# Patient Record
Sex: Male | Born: 1975
Health system: Southern US, Community
[De-identification: ages and names within clinical notes are randomized; demographics above are authoritative.]

## PROBLEM LIST (undated history)

## (undated) DIAGNOSIS — I10 Essential (primary) hypertension: Secondary | ICD-10-CM

## (undated) DIAGNOSIS — K219 Gastro-esophageal reflux disease without esophagitis: Secondary | ICD-10-CM

---

## 2000-09-06 ENCOUNTER — Emergency Department (HOSPITAL_COMMUNITY): Admission: EM | Admit: 2000-09-06 | Discharge: 2000-09-06 | Payer: Self-pay

## 2001-10-13 ENCOUNTER — Other Ambulatory Visit: Admission: RE | Admit: 2001-10-13 | Discharge: 2001-10-13 | Payer: Self-pay | Admitting: Dermatology

## 2002-01-14 ENCOUNTER — Encounter: Payer: Self-pay | Admitting: Emergency Medicine

## 2002-01-14 ENCOUNTER — Emergency Department (HOSPITAL_COMMUNITY): Admission: EM | Admit: 2002-01-14 | Discharge: 2002-01-14 | Payer: Self-pay | Admitting: Emergency Medicine

## 2002-08-11 ENCOUNTER — Emergency Department (HOSPITAL_COMMUNITY): Admission: EM | Admit: 2002-08-11 | Discharge: 2002-08-11 | Payer: Self-pay | Admitting: Emergency Medicine

## 2002-08-11 ENCOUNTER — Encounter: Payer: Self-pay | Admitting: Emergency Medicine

## 2004-11-27 ENCOUNTER — Emergency Department (HOSPITAL_COMMUNITY): Admission: EM | Admit: 2004-11-27 | Discharge: 2004-11-27 | Payer: Self-pay | Admitting: Family Medicine

## 2012-09-08 ENCOUNTER — Other Ambulatory Visit: Payer: Self-pay | Admitting: Occupational Medicine

## 2012-09-08 ENCOUNTER — Ambulatory Visit: Payer: Self-pay

## 2012-09-08 DIAGNOSIS — R52 Pain, unspecified: Secondary | ICD-10-CM

## 2013-06-16 ENCOUNTER — Encounter (HOSPITAL_COMMUNITY): Payer: Self-pay | Admitting: Emergency Medicine

## 2013-06-16 ENCOUNTER — Emergency Department (HOSPITAL_COMMUNITY)
Admission: EM | Admit: 2013-06-16 | Discharge: 2013-06-16 | Disposition: A | Discharge status: Home / Self Care | Payer: 59 | Attending: Emergency Medicine | Admitting: Emergency Medicine

## 2013-06-16 DIAGNOSIS — S0591XA Unspecified injury of right eye and orbit, initial encounter: Secondary | ICD-10-CM

## 2013-06-16 DIAGNOSIS — IMO0002 Reserved for concepts with insufficient information to code with codable children: Secondary | ICD-10-CM | POA: Insufficient documentation

## 2013-06-16 DIAGNOSIS — S0081XA Abrasion of other part of head, initial encounter: Secondary | ICD-10-CM

## 2013-06-16 DIAGNOSIS — Y9339 Activity, other involving climbing, rappelling and jumping off: Secondary | ICD-10-CM | POA: Insufficient documentation

## 2013-06-16 DIAGNOSIS — W2209XA Striking against other stationary object, initial encounter: Secondary | ICD-10-CM | POA: Insufficient documentation

## 2013-06-16 DIAGNOSIS — Y9229 Other specified public building as the place of occurrence of the external cause: Secondary | ICD-10-CM | POA: Insufficient documentation

## 2013-06-16 DIAGNOSIS — H538 Other visual disturbances: Secondary | ICD-10-CM | POA: Insufficient documentation

## 2013-06-16 DIAGNOSIS — S0590XA Unspecified injury of unspecified eye and orbit, initial encounter: Secondary | ICD-10-CM | POA: Insufficient documentation

## 2013-06-16 HISTORY — DX: Gastro-esophageal reflux disease without esophagitis: K21.9

## 2013-06-16 MED ORDER — TETRACAINE HCL 0.5 % OP SOLN
1.0000 [drp] | Freq: Once | OPHTHALMIC | Status: DC
  Filled 2013-06-16: qty 2

## 2013-06-16 MED ORDER — KETOROLAC TROMETHAMINE 0.5 % OP SOLN: 1.0000 [drp] | Freq: Four times a day (QID) | OPHTHALMIC | Status: DC

## 2013-06-16 MED ORDER — FLUORESCEIN SODIUM 1 MG OP STRP
ORAL_STRIP | OPHTHALMIC | Status: AC
  Filled 2013-06-16: qty 1

## 2013-06-16 MED ORDER — FLUORESCEIN SODIUM 1 MG OP STRP
1.0000 | ORAL_STRIP | Freq: Once | OPHTHALMIC | Status: DC
  Filled 2013-06-16: qty 1

## 2013-06-16 NOTE — ED Provider Notes (Signed)
CSN: [id number]     Arrival date & time 06/16/13  1533 History   First MD Initiated Contact with Patient 06/16/13 1535     Chief Complaint  Patient presents with  . Eye Injury   (Consider location/radiation/quality/duration/timing/severity/associated sxs/prior Treatment) Patient is a 37 y.o. male presenting with eye injury. The history is provided by the patient and medical records.  Eye Injury  This is a 37 year old male with no significant past medical history presenting to the ED for right eye injury. Patient stated he was at the park playing with his dog, attempted to jump over a rock, and  hit in his right eye on a cable wire.  States initially after injury he could not see out of the right eye but now vision has improved and is only mildly blurry.  Denies left eye involvement. Wife notes that he appeared to lose consciousness for a few seconds but was never disoriented.  Pt states there is somewhat of a foreign body sensation along his right lateral eye.  Patient does not wear glasses or contact lenses, but has had prior LASIK eye surgery on both eyes. Patient's ophthalmologist is Dr. Delaney Meigs.  Ice applied by EMS with good improvement of pain.  Denies any dizziness, confusion, tinnitus, changes in speech, or unsteady gait.  Pt AAOx3 on arrival, VS stable.  No past medical history on file. No past surgical history on file. No family history on file. History  Substance Use Topics  . Smoking status: Not on file  . Smokeless tobacco: Not on file  . Alcohol Use: Not on file    Review of Systems  Eyes: Positive for pain and visual disturbance.  All other systems reviewed and are negative.    Allergies  Review of patient's allergies indicates not on file.  Home Medications  No current outpatient prescriptions on file. BP 137/82  Pulse 59  Temp(Src) 98 F (36.7 C) (Oral)  Resp 18  SpO2 97%  Physical Exam  Nursing note and vitals reviewed. Constitutional: He is oriented to  person, place, and time. He appears well-developed and well-nourished.  Pt laughing and joking with EMS on arrival, NAD  HENT:  Head: Normocephalic. Head is with abrasion. Head is without raccoon's eyes, without Battle's sign, without contusion and without laceration.  Mouth/Throat: Oropharynx is clear and moist.  2 small abrasions noted to bridge of nose; no active bleeding  Eyes: Conjunctivae, EOM and lids are normal. Pupils are equal, round, and reactive to light. Right conjunctiva is not injected. Right conjunctiva has no hemorrhage. Right eye exhibits normal extraocular motion.  Fundoscopic exam:      The right eye shows no hemorrhage.  Slit lamp exam:      The right eye shows no corneal abrasion, no corneal ulcer, no fluorescein uptake and no anterior chamber bulge.  Conjunctiva non-injected; no hemorrhage or FB noted; PERRL (direct and consensual); EOM intact and non-painful; confrontation and peripheral vision appropriate; orbital rim is non-tender, no deformity noted; mild bruising and abrasion of right upper lid along lash line  Neck: Normal range of motion.  Cardiovascular: Normal rate, regular rhythm and normal heart sounds.   Pulmonary/Chest: Effort normal and breath sounds normal. No respiratory distress. He has no wheezes.  Abdominal: Soft. Bowel sounds are normal.  Musculoskeletal: Normal range of motion.  Neurological: He is alert and oriented to person, place, and time. He has normal strength. He displays no tremor. No cranial nerve deficit or sensory deficit. He displays no seizure activity.  Gait normal.  CN grossly intact, moves all extremities appropriately without ataxia, no focal neuro deficits or facial droop appreciated; steady, non-ataxic gait  Skin: Skin is warm and dry.  Psychiatric: He has a normal mood and affect.    ED Course  Procedures (including critical care time) Labs Review Labs Reviewed - No data to display Imaging Review No results found.  EKG  Interpretation   None       MDM   1. Eye injury, right, initial encounter   2. Facial abrasion, initial encounter    Pt AAOx3, laughing and joking with EMS on arrival, no focal neuro deficits appreciated-- doubt ICH, SAH, TIA, stroke, or other intracranial pathology at this time.  Visual acuity:  L 20/20, R 20/25-- confrontation and peripheral vision appropriate.  Woods lamp and slit lamp evaluation negative.  Spoke with Opthalmology on call for Dr. Ronnie Doss office-- advised that given negative work-up in the ED no further action needed at this time and vision will likely return to normal by tomorrow, but if problems occur either return to ED for call office for emergency appt.  Rx acular for comfort.  Discussed plan with pt and wife, they agreed.  Pt given strict return precautions should sx worsen or change-- concussion precautions given on handout.  Garlon Hatchet, PA-C 06/16/13 1934  Garlon Hatchet, PA-C 06/16/13 2128

## 2013-06-16 NOTE — ED Notes (Signed)
Pt presents to ED via EMS for eye injury. Pt states he was walking in a park and jumped over a rock and hit his face into metal close line. Abrasion noted the nose and right eye.

## 2013-06-20 NOTE — ED Provider Notes (Signed)
Medical screening examination/treatment/procedure(s) were performed by non-physician practitioner and as supervising physician I was immediately available for consultation/collaboration.  EKG Interpretation   None         Laray Anger, DO 06/20/13 0013

## 2014-04-24 ENCOUNTER — Telehealth: Payer: Self-pay | Admitting: Family Medicine

## 2014-04-24 MED ORDER — AZITHROMYCIN 250 MG PO TABS: ORAL_TABLET | ORAL | Status: DC

## 2014-04-24 NOTE — Telephone Encounter (Signed)
zpk

## 2014-04-24 NOTE — Telephone Encounter (Signed)
Rx sent electronically to pharmacy. Patient notified. 

## 2014-04-24 NOTE — Telephone Encounter (Signed)
Patient is on vacation at [place] and pts wife thinks he has come down with a sinus infection.  He is having head congestion, green and yellow mucous, no fever. Would like to know if we can call something in to [facility]. P# [phone]  Has not been seen since 2013.

## 2014-04-24 NOTE — Telephone Encounter (Signed)
Last seen here 2013

## 2015-04-10 ENCOUNTER — Ambulatory Visit (INDEPENDENT_AMBULATORY_CARE_PROVIDER_SITE_OTHER): Payer: Commercial Managed Care - HMO | Admitting: Family Medicine

## 2015-04-10 ENCOUNTER — Encounter: Payer: Self-pay | Admitting: Family Medicine

## 2015-04-10 VITALS — BP 122/90 | Ht 71.0 in | Wt 215.0 lb

## 2015-04-10 DIAGNOSIS — R030 Elevated blood-pressure reading, without diagnosis of hypertension: Secondary | ICD-10-CM

## 2015-04-10 DIAGNOSIS — R5383 Other fatigue: Secondary | ICD-10-CM

## 2015-04-10 DIAGNOSIS — E785 Hyperlipidemia, unspecified: Secondary | ICD-10-CM | POA: Diagnosis not present

## 2015-04-10 DIAGNOSIS — IMO0001 Reserved for inherently not codable concepts without codable children: Secondary | ICD-10-CM

## 2015-04-10 NOTE — Progress Notes (Signed)
   Subjective:    Patient ID: Nathaniel Reed, male    DOB: 07-10-1976, 39 y.o.   MRN: [id number]  HPIpt here for bp check. Was on vacation and bp was 170/110.   Pt brought bp machine and readings are 140's over 90's  . Pt states he does not eat healthy. Pt does exercise. He swims and runs. Pt concerned bc father had stroke two years ago. 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  Last coupld days numb sl elevated at his own cuff   Patient notes fatigue at times. Also was told he had elevated cholesterol. No blood work lately.   Review of Systems No headache no chest pain no back pain no abdominal pain    Objective:   Physical Exam  Alert vitals stable blood pressure on repeat good 134/82. Lungs clear. Heart regular in rhythm.      Assessment & Plan:  Impression 1 elevated blood pressure discuss not enough for medicines patient quite anxious about it #2 substantial family history of stroke and coronary artery disease #3 fatigue etiology unclear #4 hyperlipidemia status uncertain plan appropriate blood work. Diet exercise discussed. Cut down salt intake recheck in 4 months. Screening own blood pressures further recommendations based on results.

## 2015-04-14 LAB — BASIC METABOLIC PANEL
BUN / CREAT RATIO: 14 (ref 9–20)
BUN: 15 mg/dL (ref 6–24)
CO2: 26 mmol/L (ref 18–29)
Calcium: 9.2 mg/dL (ref 8.7–10.2)
Chloride: 105 mmol/L (ref 97–108)
Creatinine, Ser: 1.11 mg/dL (ref 0.76–1.27)
GFR calc Af Amer: 96 mL/min/{1.73_m2} (ref >59)
GFR calc non Af Amer: 83 mL/min/{1.73_m2} (ref >59)
GLUCOSE: 94 mg/dL (ref 65–99)
POTASSIUM: 4.7 mmol/L (ref 3.5–5.2)
Sodium: 144 mmol/L (ref 134–144)

## 2015-04-14 LAB — LIPID PANEL
CHOL/HDL RATIO: 5.3 ratio — AB (ref 0.0–5.0)
CHOLESTEROL TOTAL: 165 mg/dL (ref 100–199)
HDL: 31 mg/dL — AB (ref >39)
LDL CALC: 103 mg/dL — AB (ref 0–99)
TRIGLYCERIDES: 153 mg/dL — AB (ref 0–149)
VLDL Cholesterol Cal: 31 mg/dL (ref 5–40)

## 2015-04-14 LAB — CBC WITH DIFFERENTIAL/PLATELET
Basophils Absolute: 0 10*3/uL (ref 0.0–0.2)
Basos: 1 %
EOS (ABSOLUTE): 0.2 10*3/uL (ref 0.0–0.4)
Eos: 5 %
HEMOGLOBIN: 16.2 g/dL (ref 12.6–17.7)
Hematocrit: 46.5 % (ref 37.5–51.0)
Immature Grans (Abs): 0 10*3/uL (ref 0.0–0.1)
Immature Granulocytes: 0 %
Lymphocytes Absolute: 1.8 10*3/uL (ref 0.7–3.1)
Lymphs: 40 %
MCH: 32.3 pg (ref 26.6–33.0)
MCHC: 34.8 g/dL (ref 31.5–35.7)
MCV: 93 fL (ref 79–97)
MONOCYTES: 8 %
MONOS ABS: 0.4 10*3/uL (ref 0.1–0.9)
Neutrophils Absolute: 2.1 10*3/uL (ref 1.4–7.0)
Neutrophils: 46 %
Platelets: 180 10*3/uL (ref 150–379)
RBC: 5.02 x10E6/uL (ref 4.14–5.80)
RDW: 13.5 % (ref 12.3–15.4)
WBC: 4.5 10*3/uL (ref 3.4–10.8)

## 2015-04-14 LAB — HEPATIC FUNCTION PANEL
ALT: 19 IU/L (ref 0–44)
AST: 18 IU/L (ref 0–40)
Albumin: 4.5 g/dL (ref 3.5–5.5)
Alkaline Phosphatase: 48 IU/L (ref 39–117)
Bilirubin Total: 0.7 mg/dL (ref 0.0–1.2)
Bilirubin, Direct: 0.15 mg/dL (ref 0.00–0.40)
Total Protein: 6.7 g/dL (ref 6.0–8.5)

## 2015-04-14 LAB — TSH: TSH: 1.32 u[IU]/mL (ref 0.450–4.500)

## 2015-04-15 ENCOUNTER — Encounter: Payer: Self-pay | Admitting: Family Medicine

## 2015-04-22 ENCOUNTER — Encounter: Payer: Self-pay | Admitting: Family Medicine

## 2015-04-22 ENCOUNTER — Ambulatory Visit (INDEPENDENT_AMBULATORY_CARE_PROVIDER_SITE_OTHER): Payer: Commercial Managed Care - HMO | Admitting: Family Medicine

## 2015-04-22 VITALS — BP 122/88 | Temp 98.2°F | Ht 71.0 in | Wt 218.5 lb

## 2015-04-22 DIAGNOSIS — R109 Unspecified abdominal pain: Secondary | ICD-10-CM | POA: Diagnosis not present

## 2015-04-22 LAB — POCT URINALYSIS DIPSTICK
LEUKOCYTES UA: NEGATIVE
Protein, UA: NEGATIVE
RBC UA: NEGATIVE
Spec Grav, UA: 1.01
pH, UA: 6

## 2015-04-22 MED ORDER — ETODOLAC 400 MG PO TABS: ORAL_TABLET | ORAL | Status: DC

## 2015-04-22 NOTE — Progress Notes (Signed)
   Subjective:    Patient ID: Nathaniel Reed, male    DOB: April 11, 1976, 39 y.o.   MRN: [id number]  Flank Pain This is a new problem. The current episode started 1 to 4 weeks ago. The problem occurs constantly. The problem is unchanged. The pain is present in the lumbar spine. The quality of the pain is described as stabbing. The pain is at a severity of 7/10. The pain is moderate. The symptoms are aggravated by bending. He has tried nothing for the symptoms.   Patient is experiencing left side/ left lower back pain. Onset of symptoms two weeks ago.   First noticed the pain about  Couple wks ago  Woke pt up sharp pain, pain was uncomfortable, could not get in a comfortable position  Using ibuprofen takes several per day aleavre prn   Fa has hx of kidney stones  Pt has dehydration intermittently off and on   Review of Systems  Genitourinary: Positive for flank pain.    no chest pain no headache no change in bowel habits    Objective:   Physical Exam   alert slight malaise. Slightly uncomfortable HEENT normal spine nontender plus minus left CVA tenderness negative lumbar Terrace negative straight leg raise. Abdomen benign.   Urinalysis 0-1 red blood cells are 1 white blood cells      Assessment & Plan:   impression flank pain with colicky aspect off and on since duration. Minimal association with movement in fact none. With family history stones and colicky nature description feel at least a screening ultrasound is warranted discussed with patient multiple questions answered 25 minutes spent most in discussion plan Lodine 400 twice a day with food. Symptom care discussed. Ultrasound renal , further recommendations based on results WSL

## 2015-04-23 ENCOUNTER — Ambulatory Visit (HOSPITAL_COMMUNITY)
Admission: RE | Admit: 2015-04-23 | Discharge: 2015-04-23 | Disposition: A | Discharge status: Home / Self Care | Payer: Commercial Managed Care - HMO | Source: Ambulatory Visit | Attending: Family Medicine | Admitting: Family Medicine

## 2015-04-23 DIAGNOSIS — N200 Calculus of kidney: Secondary | ICD-10-CM | POA: Diagnosis not present

## 2015-04-23 DIAGNOSIS — R109 Unspecified abdominal pain: Secondary | ICD-10-CM | POA: Diagnosis present

## 2015-04-24 NOTE — Addendum Note (Signed)
Addended by: Dairl Ponder on: 04/24/2015 09:42 AM   Modules accepted: Orders

## 2015-04-26 ENCOUNTER — Telehealth: Payer: Self-pay | Admitting: Family Medicine

## 2015-04-26 NOTE — Telephone Encounter (Signed)
Pt was seen for lower back pain and was given an anti inflammatory and was sent for an [place]. US revealed a kidney stone and pt is wanting to know if it is safe for him to continue to take the anti inflammatory or if he should discontinue it now that it is a kidney stone instead of a pulled muscle. Pt states that it is helping with the pain but wife is worried that the anti inflammatory may be bad for his kidneys. Pt also stated that his dog was prescribed tramadol and that if he gets in too much pain that he would take that.

## 2015-04-26 NOTE — Telephone Encounter (Signed)
Notified patient anti-inflammatory medicine is actually med of choice if pain coming from a kidney stone, much better than narcotics,taking this for years could harm kidneys not short term. Patient verbalized understanding.

## 2015-04-26 NOTE — Telephone Encounter (Signed)
Anti inflam med is actually med of choice if pain coming from a kidney stone, nuch better than narcotics,taking this for yrs could harm kidneys not short term

## 2015-05-10 ENCOUNTER — Ambulatory Visit (HOSPITAL_COMMUNITY)
Admission: RE | Admit: 2015-05-10 | Discharge: 2015-05-10 | Disposition: A | Discharge status: Home / Self Care | Payer: Commercial Managed Care - HMO | Source: Ambulatory Visit | Attending: Urology | Admitting: Urology

## 2015-05-10 ENCOUNTER — Other Ambulatory Visit: Payer: Self-pay | Admitting: Urology

## 2015-05-10 ENCOUNTER — Ambulatory Visit (INDEPENDENT_AMBULATORY_CARE_PROVIDER_SITE_OTHER): Payer: Commercial Managed Care - HMO | Admitting: Urology

## 2015-05-10 DIAGNOSIS — N200 Calculus of kidney: Secondary | ICD-10-CM | POA: Diagnosis not present

## 2015-05-13 ENCOUNTER — Ambulatory Visit (HOSPITAL_COMMUNITY)
Admission: RE | Admit: 2015-05-13 | Discharge: 2015-05-13 | Disposition: A | Discharge status: Home / Self Care | Payer: Commercial Managed Care - HMO | Source: Ambulatory Visit | Attending: Urology | Admitting: Urology

## 2015-05-13 DIAGNOSIS — N200 Calculus of kidney: Secondary | ICD-10-CM | POA: Insufficient documentation

## 2015-05-15 ENCOUNTER — Other Ambulatory Visit: Payer: Self-pay | Admitting: Urology

## 2015-05-31 ENCOUNTER — Ambulatory Visit (HOSPITAL_BASED_OUTPATIENT_CLINIC_OR_DEPARTMENT_OTHER): Admit: 2015-05-31 | Payer: Commercial Managed Care - HMO | Admitting: Urology

## 2015-05-31 ENCOUNTER — Encounter (HOSPITAL_BASED_OUTPATIENT_CLINIC_OR_DEPARTMENT_OTHER): Payer: Self-pay

## 2015-05-31 SURGERY — CYSTOSCOPY, WITH CALCULUS REMOVAL USING BASKET
Anesthesia: General | Laterality: Left

## 2015-08-08 ENCOUNTER — Ambulatory Visit (INDEPENDENT_AMBULATORY_CARE_PROVIDER_SITE_OTHER): Payer: Commercial Managed Care - HMO | Admitting: Family Medicine

## 2015-08-08 ENCOUNTER — Encounter: Payer: Self-pay | Admitting: Family Medicine

## 2015-08-08 VITALS — BP 130/90 | Ht 71.0 in | Wt 224.0 lb

## 2015-08-08 DIAGNOSIS — I10 Essential (primary) hypertension: Secondary | ICD-10-CM | POA: Diagnosis not present

## 2015-08-08 MED ORDER — ENALAPRIL MALEATE 10 MG PO TABS: 10.0000 mg | ORAL_TABLET | Freq: Every day | ORAL | Status: DC

## 2015-08-08 NOTE — Progress Notes (Signed)
   Subjective:    Patient ID: Nathaniel Reed, male    DOB: 02/26/1976, 39 y.o.   MRN: [id number]  Hypertension This is a chronic problem. The current episode started more than 1 year ago. There are no associated agents to hypertension. There are no known risk factors for coronary artery disease. Treatments tried: diet. The current treatment provides mild improvement. There are no compliance problems.    Patient states he has no new concerns at this time.   Patient states his BP has been averaging around 140/90.   BP  Both parent shad hi b p,   Fa had strokem bro had heart attack  Has changed on the stress level at work Review of Systems No headache no chest pain no back pain no abdominal pain no change in bowel habits ROS otherwise negative    Objective:   Physical Exam  Alert no acute distress vital stable blood pressure 142/92 on repeat HEENT normal. Lungs clear. Heart regular rate and rhythm.      Assessment & Plan:  Impression essential hypertension discussed at great length with patient and family. Many questions answered. Patient somewhat reluctant to consider medicine benefits side effects discussed patient wishes to press on plan initiate enalapril 10 daily at bedtime. Diet exercise discussed educational information given easily 30 minutes spent most in discussion

## 2015-08-12 DIAGNOSIS — I10 Essential (primary) hypertension: Secondary | ICD-10-CM | POA: Insufficient documentation

## 2015-09-20 ENCOUNTER — Telehealth: Payer: Self-pay | Admitting: Family Medicine

## 2015-09-20 MED ORDER — ENALAPRIL MALEATE 10 MG PO TABS: 10.0000 mg | ORAL_TABLET | Freq: Every day | ORAL | Status: DC

## 2015-09-20 NOTE — Telephone Encounter (Signed)
Pt is requesting a 90 day supply of his enalapril (VASOTEC) 10 MG tablet.      [facility]

## 2015-09-20 NOTE — Telephone Encounter (Signed)
Spoke with patient and informed him per protocol 90 day supply of enalapril was sent into [facility]. Patient verbalized understanding.

## 2015-11-06 ENCOUNTER — Ambulatory Visit: Payer: Commercial Managed Care - HMO | Admitting: Family Medicine

## 2015-11-08 ENCOUNTER — Ambulatory Visit (INDEPENDENT_AMBULATORY_CARE_PROVIDER_SITE_OTHER): Payer: Commercial Managed Care - HMO | Admitting: Family Medicine

## 2015-11-08 ENCOUNTER — Encounter: Payer: Self-pay | Admitting: Family Medicine

## 2015-11-08 VITALS — BP 128/86 | Ht 71.0 in | Wt 222.0 lb

## 2015-11-08 DIAGNOSIS — I10 Essential (primary) hypertension: Secondary | ICD-10-CM

## 2015-11-08 MED ORDER — ENALAPRIL MALEATE 10 MG PO TABS: 10.0000 mg | ORAL_TABLET | Freq: Every day | ORAL | Status: DC

## 2015-11-08 NOTE — Progress Notes (Signed)
   Subjective:    Patient ID: Nathaniel Reed, male    DOB: 15-Apr-1976, 40 y.o.   MRN: [id number]  Hypertension This is a new problem. Episode onset: 3 months ago. Treatments tried: enalapril. There are no compliance problems.   pt states sometimes throat feels scratchy since starting med.  No major cough no wheezing no shortness of breath  Uses it at seven o clock  Felt new symptoms but now better  transietn low b p  Exercise, reg  Watching salt intake  Pt states no other concerns or issues.       Review of Systems No headache, no major weight loss or weight gain, no chest pain no back pain abdominal pain no change in bowel habits complete ROS otherwise negative     Objective:   Physical Exam  Alert vital stable blood pressure excellent on repeat HEENT normal. Lungs clear heart rare rhythm ankles without edema      Assessment & Plan:  Impression 1 hypertension good control blood pressures reviewed on patient's personal monitor #2 very minimal tickling cough patient wishes cyst down medicines plan maintain same. Diet exercise discussed. Recheck in 6 months

## 2016-02-10 ENCOUNTER — Other Ambulatory Visit: Payer: Self-pay | Admitting: Family Medicine

## 2016-02-22 IMAGING — CT CT RENAL STONE PROTOCOL
2 of 4 series · 16 of 46 positions shown, 18 images · non-contrast
Comparison: 05/10/2015 abdominal radiograph .

CLINICAL DATA: Left flank pain for 1.5 months. History of
nephrolithiasis.

EXAM:
CT ABDOMEN AND PELVIS WITHOUT CONTRAST
TECHNIQUE: Multidetector CT imaging of the abdomen and pelvis was performed
following the standard protocol without IV contrast.

[Series 2: standard/full over (age)lbs 5.0 · axial · 0.78mm/px · z∈[-524,-64]mm · 13 of 100 slices shown, 15 images]
[im 4/100  soft-tissue]
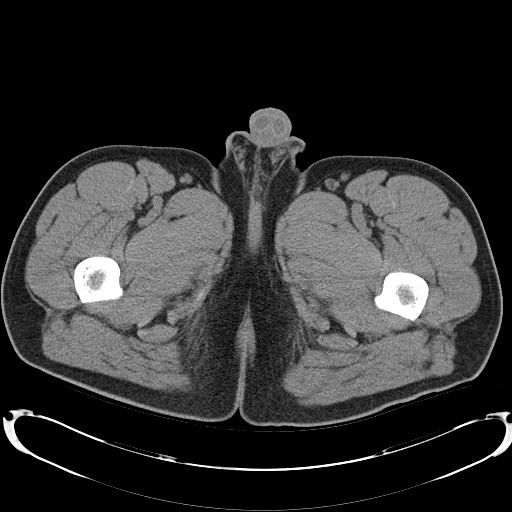
[im 4/100  bone]
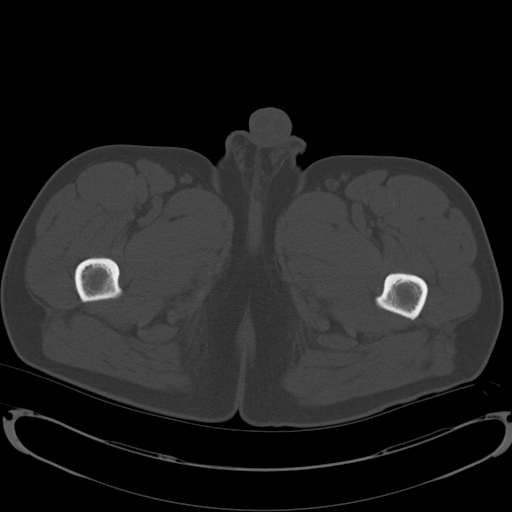
[im 12/100  soft-tissue]
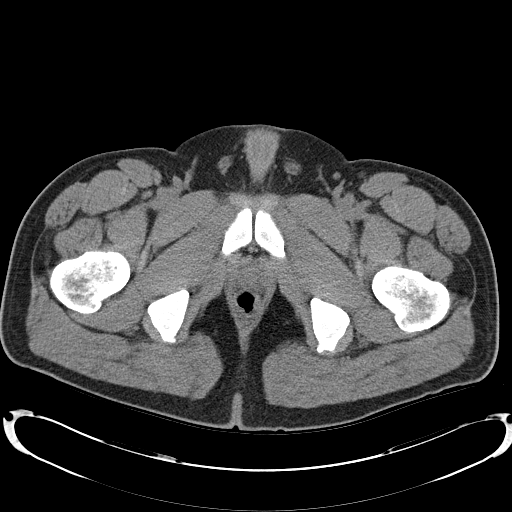
[im 20/100  soft-tissue]
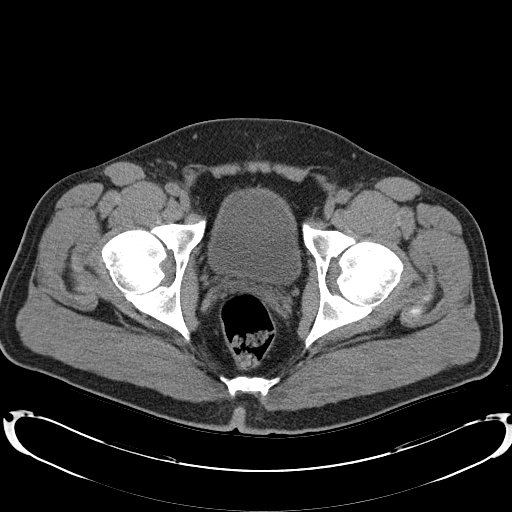
[im 28/100  soft-tissue]
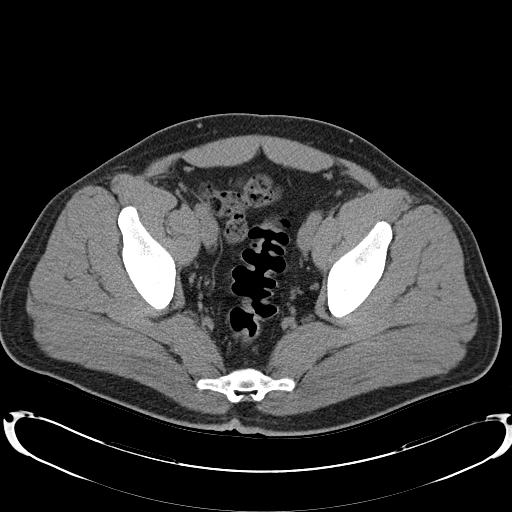
[im 36/100  soft-tissue]
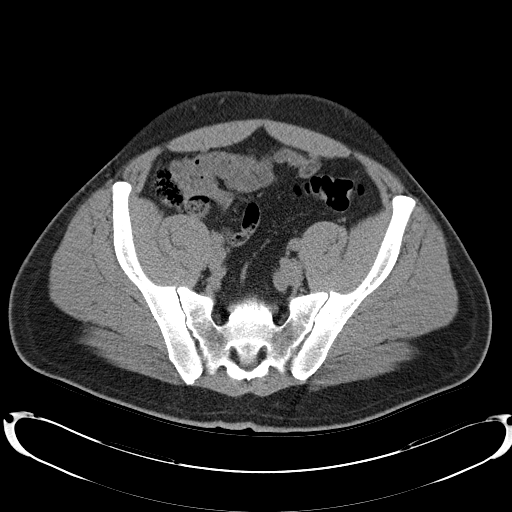
[im 44/100  soft-tissue]
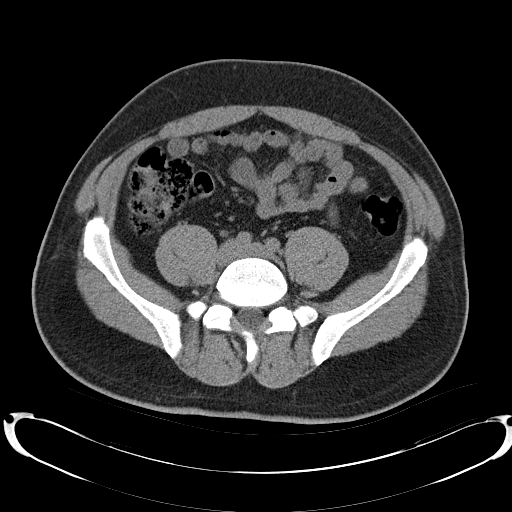
[im 52/100  soft-tissue]
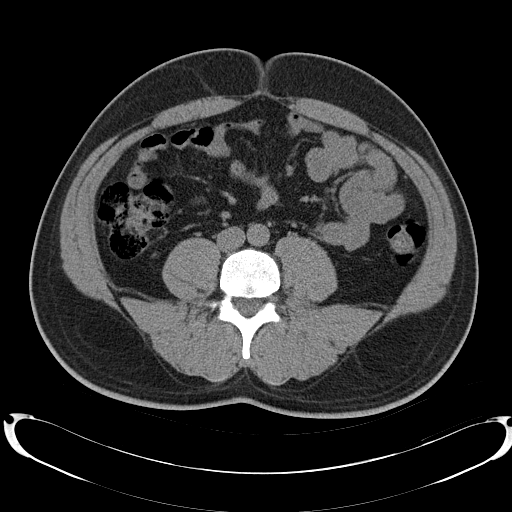
[im 56/100  soft-tissue]
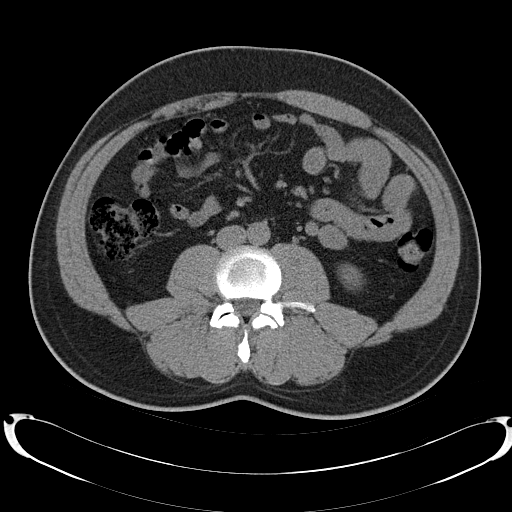
[im 64/100  soft-tissue]
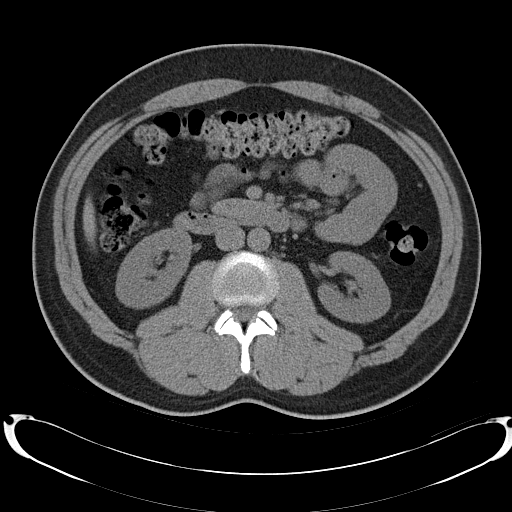
[im 64/100  bone]
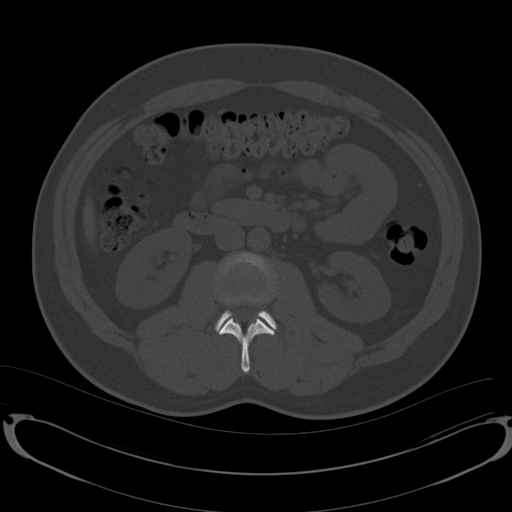
[im 72/100  soft-tissue]
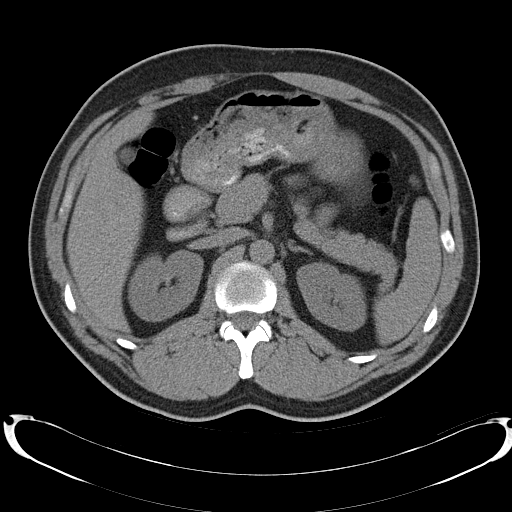
[im 80/100  soft-tissue]
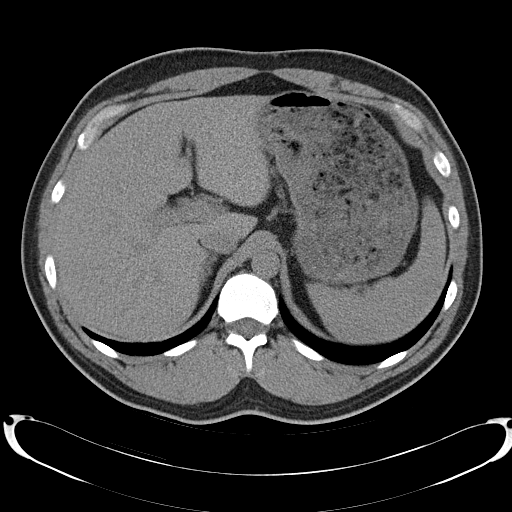
[im 88/100  soft-tissue]
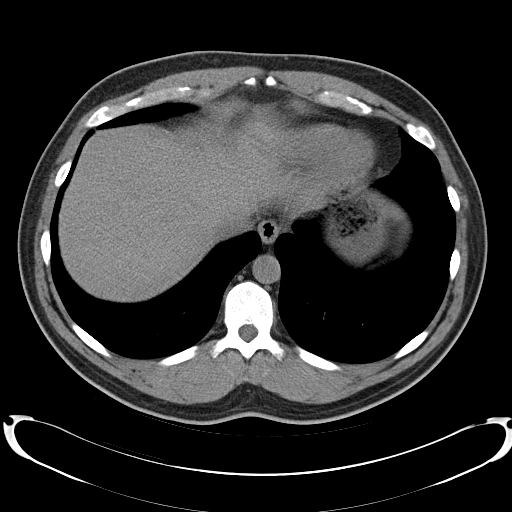
[im 96/100  soft-tissue]
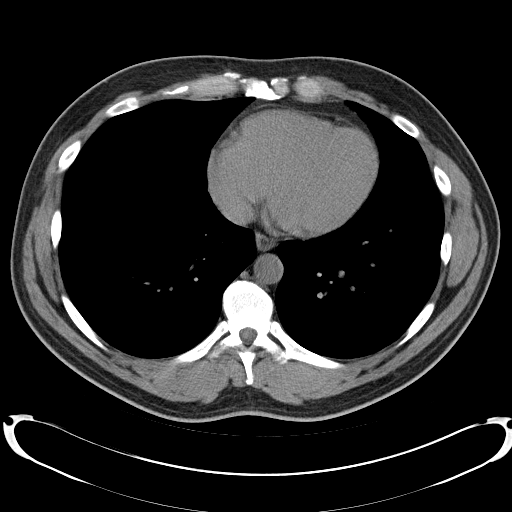

[Series 4: mpr coronal · coronal · 0.73mm/px · 3 of 91 slices shown]
[im 31/91  soft-tissue]
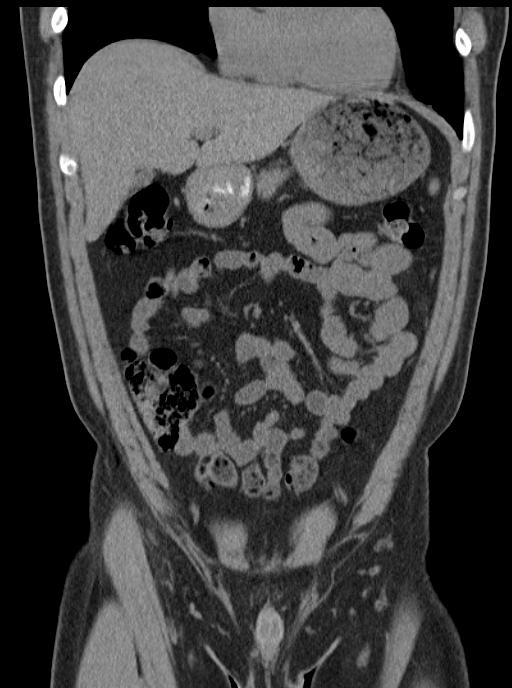
[im 41/91  soft-tissue]
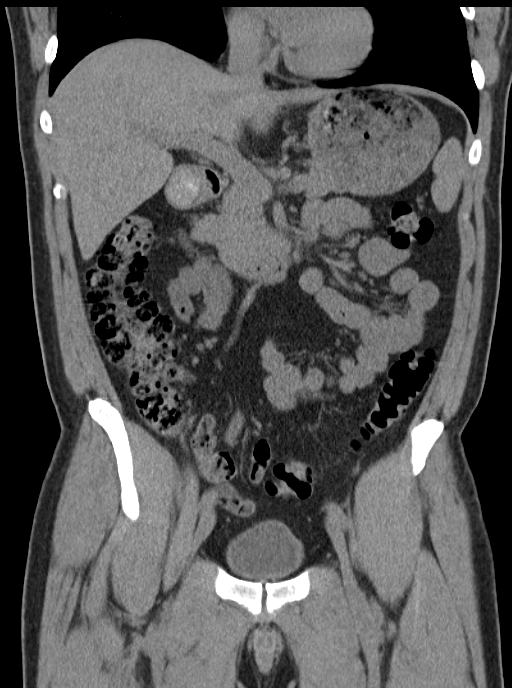
[im 51/91  soft-tissue]
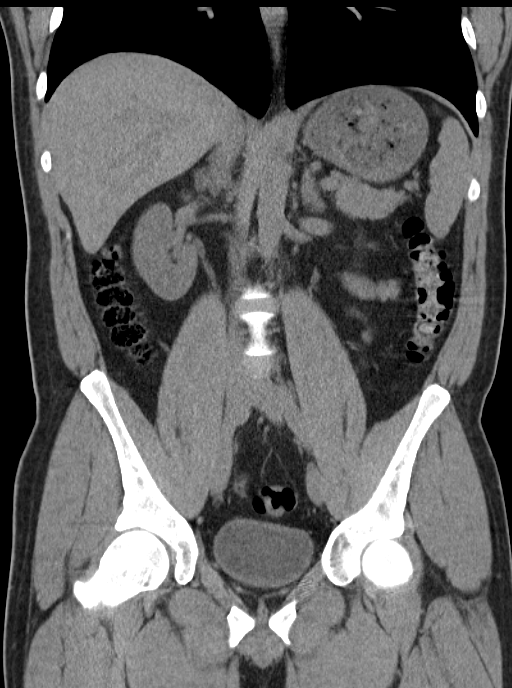

[16 of 46 positions shown; findings below may reference images not displayed]

FINDINGS: Lower chest: No significant pulmonary nodules or acute consolidative
airspace disease.

Hepatobiliary: Normal liver with no liver mass. Normal gallbladder
with no radiopaque cholelithiasis. No biliary ductal dilatation.

Pancreas: Normal, with no mass or duct dilation.

Spleen: Normal size. No mass.

Adrenals/Urinary Tract: There are two nonobstructing 2 mm stones in
the interpolar left kidney. No right renal stones. No
hydronephrosis. No contour deforming renal masses. Normal bladder.

Stomach/Bowel: Grossly normal stomach. Normal caliber small bowel
with no small bowel wall thickening. Normal appendix. Normal large
bowel with no diverticulosis, large bowel wall thickening or
pericolonic fat stranding.

Vascular/Lymphatic: Normal caliber abdominal aorta. No
pathologically enlarged lymph nodes in the abdomen or pelvis.

Reproductive: Normal prostate .

Other: No pneumoperitoneum, ascites or focal fluid collection.

Musculoskeletal: No aggressive appearing focal osseous lesions.
Small fat containing left inguinal hernia.
IMPRESSION: Two nonobstructing 2 mm interpolar left renal stones. No evidence of
urinary tract obstruction.

## 2016-03-31 ENCOUNTER — Emergency Department (HOSPITAL_COMMUNITY): Payer: Worker's Compensation

## 2016-03-31 ENCOUNTER — Other Ambulatory Visit: Payer: Self-pay | Admitting: Cardiology

## 2016-03-31 ENCOUNTER — Encounter (HOSPITAL_COMMUNITY): Payer: Self-pay

## 2016-03-31 ENCOUNTER — Emergency Department (HOSPITAL_COMMUNITY)
Admission: EM | Admit: 2016-03-31 | Discharge: 2016-03-31 | Disposition: A | Discharge status: Home / Self Care | Payer: Worker's Compensation | Attending: Emergency Medicine | Admitting: Emergency Medicine

## 2016-03-31 DIAGNOSIS — R001 Bradycardia, unspecified: Secondary | ICD-10-CM | POA: Diagnosis not present

## 2016-03-31 DIAGNOSIS — R55 Syncope and collapse: Secondary | ICD-10-CM

## 2016-03-31 DIAGNOSIS — I10 Essential (primary) hypertension: Secondary | ICD-10-CM | POA: Diagnosis not present

## 2016-03-31 DIAGNOSIS — Z8249 Family history of ischemic heart disease and other diseases of the circulatory system: Secondary | ICD-10-CM

## 2016-03-31 HISTORY — DX: Essential (primary) hypertension: I10

## 2016-03-31 LAB — HEPATIC FUNCTION PANEL
ALT: 23 U/L (ref 17–63)
AST: 20 U/L (ref 15–41)
Albumin: 4 g/dL (ref 3.5–5.0)
Alkaline Phosphatase: 39 U/L (ref 38–126)
BILIRUBIN DIRECT: 0.2 mg/dL (ref 0.1–0.5)
BILIRUBIN INDIRECT: 0.6 mg/dL (ref 0.3–0.9)
Total Bilirubin: 0.8 mg/dL (ref 0.3–1.2)
Total Protein: 6.4 g/dL — ABNORMAL LOW (ref 6.5–8.1)

## 2016-03-31 LAB — BASIC METABOLIC PANEL
Anion gap: 6 (ref 5–15)
BUN: 17 mg/dL (ref 6–20)
CO2: 25 mmol/L (ref 22–32)
Calcium: 9 mg/dL (ref 8.9–10.3)
Chloride: 109 mmol/L (ref 101–111)
Creatinine, Ser: 1.13 mg/dL (ref 0.61–1.24)
GFR calc Af Amer: >60 mL/min (ref >60)
Glucose, Bld: 102 mg/dL — ABNORMAL HIGH (ref 65–99)
POTASSIUM: 4 mmol/L (ref 3.5–5.1)
Sodium: 140 mmol/L (ref 135–145)

## 2016-03-31 LAB — CBC
HEMATOCRIT: 44.6 % (ref 39.0–52.0)
Hemoglobin: 16.1 g/dL (ref 13.0–17.0)
MCH: 32.8 pg (ref 26.0–34.0)
MCHC: 36.1 g/dL — AB (ref 30.0–36.0)
MCV: 90.8 fL (ref 78.0–100.0)
Platelets: 141 10*3/uL — ABNORMAL LOW (ref 150–400)
RBC: 4.91 MIL/uL (ref 4.22–5.81)
RDW: 12.3 % (ref 11.5–15.5)
WBC: 5.4 10*3/uL (ref 4.0–10.5)

## 2016-03-31 LAB — URINALYSIS, ROUTINE W REFLEX MICROSCOPIC
BILIRUBIN URINE: NEGATIVE
Glucose, UA: NEGATIVE mg/dL
Hgb urine dipstick: NEGATIVE
Ketones, ur: NEGATIVE mg/dL
Leukocytes, UA: NEGATIVE
Nitrite: NEGATIVE
PH: 6.5 (ref 5.0–8.0)
Protein, ur: NEGATIVE mg/dL
Specific Gravity, Urine: 1.012 (ref 1.005–1.030)

## 2016-03-31 LAB — I-STAT TROPONIN, ED
TROPONIN I, POC: 0 ng/mL (ref 0.00–0.08)
TROPONIN I, POC: 0 ng/mL (ref 0.00–0.08)

## 2016-03-31 LAB — MAGNESIUM: Magnesium: 2.1 mg/dL (ref 1.7–2.4)

## 2016-03-31 MED ORDER — ONDANSETRON HCL 4 MG/2ML IJ SOLN
4.0000 mg | Freq: Once | INTRAMUSCULAR | Status: AC
Start: 2016-03-31 — End: 2016-03-31
  Administered 2016-03-31: 4 mg via INTRAVENOUS
  Filled 2016-03-31: qty 2

## 2016-03-31 MED ORDER — SODIUM CHLORIDE 0.9 % IV BOLUS (SEPSIS)
1000.0000 mL | Freq: Once | INTRAVENOUS | Status: AC
  Administered 2016-03-31: 1000 mL via INTRAVENOUS

## 2016-03-31 MED ORDER — SODIUM CHLORIDE 0.9 % IV SOLN
Freq: Once | INTRAVENOUS | Status: AC
  Administered 2016-03-31: 12:00:00 via INTRAVENOUS

## 2016-03-31 NOTE — Consult Note (Addendum)
Cardiology Consult    Patient ID: Nathaniel Reed MRN: [id number], DOB/AGE: 1976-06-14   Admit date: 03/31/2016 Date of Consult: 03/31/2016  Primary Physician: Mickie Hillier, MD Primary Cardiologist: New Requesting Provider: Dr. Johnney Killian Reason for Consultation: Syncope  Patient Profile    40 yo male with PMH of HTN and GERD who presented to the ED after sudden onset of weakness, and near syncopal episode when he woke up this morning.   Past Medical History   Past Medical History:  Diagnosis Date  . Acid reflux   . Hypertension     History reviewed. No pertinent surgical history.   Allergies  No Known Allergies  History of Present Illness    Nathaniel Reed is a 40 yo male with HTN and GERD who currently works as a Set designer. Reports he gets physical every year as a requirement of his job, and has no known coronary artery disease that he is aware of. Ports he has been in his usual state of health, and is very physically active at his job. Reports yesterday he ran multiple drill exercises outside without any episodes of angina or dyspnea. Felt normal when he went to bed last night, and did have a 4 AM call which he responded to and stated he felt normal at that time. Did proceed to come back to the station and went back to bed. States he woke up around 7 AM and immediately felt "not right". Reports feeling weak and unsteady on his feet whenever he receded to get up out of bed. Proceeded to the bathroom to attempt to brush his teeth, and had difficulty grabbing the toothbrush related to "lack of coordination". States he became concerned and had a fellow coworker check his blood pressure which was noted that 140/100. Also checked his blood sugar and heart rate which he reports as normal. He then instructed his coworker that he felt like he was going to "passed out", and wanted them to call EMS. He then proceeded to lie down on the floor, and reported that his vision narrowed and  became dark, but does not feel like he had a full syncopal episode. He denies any episodes of angina, palpitations, or dyspnea with this event. Reports that he did have an echocardiogram of his heart many years ago, and feels like the results were normal.   Does report a strong family history in that his older brother had a multivessel CABG at age 72, younger brother with noted hypertension, and father with history of stroke and hypertension.   In the ED his labs showed normal electrolytes, one negative POC troponin, stable hemoglobin, and negative chest x-ray. EKG shows sinus bradycardia without acute ST/T-wave abnormalities. Of note on telemetry his heart rate appears to be in the mid 40s to 50s, which he believes is his baseline heart rate. He is currently without chest pain, dyspnea, or neurological symptoms.  Inpatient Medications      Family History    Family History  Problem Relation Age of Onset  . Stroke Father   . Hypertension Father   . Heart disease Brother     Social History    Social History   Social History  . Marital status: Single    Spouse name: N/A  . Number of children: N/A  . Years of education: N/A   Occupational History  . Not on file.   Social History Main Topics  . Smoking status: Never Smoker  . Smokeless tobacco: Never Used  . Alcohol  use No  . Drug use: No  . Sexual activity: Not on file   Other Topics Concern  . Not on file   Social History Narrative  . No narrative on file     Review of Systems    General:  No chills, fever, night sweats or weight changes.  Cardiovascular:  No chest pain, dyspnea on exertion, edema, orthopnea, palpitations, paroxysmal nocturnal dyspnea. Dermatological: No rash, lesions/masses Respiratory: No cough, dyspnea Urologic: No hematuria, dysuria Abdominal:   No nausea, vomiting, diarrhea, bright red blood per rectum, melena, or hematemesis Neurologic:  See HPI All other systems reviewed and are otherwise  negative except as noted above.  Physical Exam    Blood pressure 129/77, pulse (!) 54, temperature 97.9 F (36.6 C), temperature source Oral, resp. rate 14, height 5\' 11"  (1.803 m), weight 225 lb (102.1 kg), SpO2 99 %.  General: Pleasant Caucasian male, NAD Psych: Normal affect. Neuro: Alert and oriented X 3. Moves all extremities spontaneously. HEENT: Normal  Neck: Supple without bruits or JVD. Lungs:  Resp regular and unlabored, CTA. Heart: RRR no s3, s4, or murmurs. Abdomen: Soft, non-tender, non-distended, BS + x 4.  Extremities: No clubbing, cyanosis or edema. DP/PT/Radials 2+ and equal bilaterally.  Labs    Troponin [facility] of Care Test)  Recent Labs  03/31/16 0847  TROPIPOC 0.00   No results for input(s): CKTOTAL, CKMB, TROPONINI in the last 72 hours. Lab Results  Component Value Date   WBC 5.4 03/31/2016   HGB 16.1 03/31/2016   HCT 44.6 03/31/2016   MCV 90.8 03/31/2016   PLT 141 (L) 03/31/2016     Recent Labs Lab 03/31/16 0844  NA 140  K 4.0  CL 109  CO2 25  BUN 17  CREATININE 1.13  CALCIUM 9.0  PROT 6.4*  BILITOT 0.8  ALKPHOS 39  ALT 23  AST 20  GLUCOSE 102*   Lab Results  Component Value Date   CHOL 165 04/13/2015   HDL 31 (L) 04/13/2015   LDLCALC 103 (H) 04/13/2015   TRIG 153 (H) 04/13/2015   No results found for: [facility]   Radiology Studies    Dg Chest 2 View  Result Date: 03/31/2016 CLINICAL DATA:  Near syncope EXAM: CHEST  2 VIEW COMPARISON:  None. FINDINGS: The heart size and mediastinal contours are within normal limits. Both lungs are clear. The visualized skeletal structures are unremarkable. IMPRESSION: No active cardiopulmonary disease. Electronically Signed   By: Franchot Gallo M.D.   On: 03/31/2016 09:49   Ct Head Wo Contrast  Result Date: 03/31/2016 CLINICAL DATA:  Acute onset dizziness.  Near syncope EXAM: CT HEAD WITHOUT CONTRAST TECHNIQUE: Contiguous axial images were obtained from the base of the skull through the vertex  without intravenous contrast. COMPARISON:  None. FINDINGS: Brain: Ventricle size normal. Cerebral volume normal. Negative for acute or chronic infarction. Negative for hemorrhage or mass. No shift of the midline structures. Vascular: Negative Skull: Negative Sinuses/Orbits: Negative Other: Negative IMPRESSION: Negative Electronically Signed   By: Franchot Gallo M.D.   On: 03/31/2016 09:42    ECG & Cardiac Imaging    EKG: SB without ST/T wave changes  Echo: None  Assessment & Plan    68 male with PMH of HTN and GERD who presented to the ED with near syncopal episode and weakness.   1. Near Syncope: Reports waking up around 7am this morning and feeling "not right". States he got up he felt unsteady on his feet and uncoordinated. Proceeded  to the bathroom and had difficulty using his toothbrush. Acidophilic coworker to check his blood pressure and reported as 140/100, with normal CABG and heart rate. States he then felt as if he were going to "pass out", and laid on the floor. Reports that his vision became narrow and dark, but does not feel like he had a full syncopal episode. In the ED his EKG showed sinus bradycardia without/T-wave changes. Initial POC troponin is negative. Chest x-ray is negative. He is currently without angina, palpitations, or dyspnea. He reports his only significant medical history is hypertension which he was diagnosed with about a year ago and placed on enalapril. Did have some difficulty adjusting to this medication and orthostatic within the first couple weeks of starting this but has since resolved. Reports at last office visit he was told his cholesterol was borderline, and PCP would continue to monitor. Does report family history of his brother having multivessel CABG at age 73.  -- Given his strong family history I believe he would benefit from further cardiac workup, but this can most likely be done in the outpatient setting considering he has not currently symptomatic and  initial labs have not been concerning. Denies any palpitations, or history of arrhythmias but symptoms may warrant outpatient monitor. Would likely benefit from outpatient echocardiogram. Will discuss further recommendations with attending.  2. HTN: Stable in the ED. Continue home medications.   ** Have arranged for outpatient stress echo and cardiac even monitor, along with follow up in the office.    Barnet Pall, NP-C Pager [phone] 03/31/2016, 1:48 PM   I have seen, examined and evaluated the patient this PM along with Ms. Mancel Bale, NP-c.  After reviewing all the available data and chart, we discussed the patients laboratory, study & physical findings as well as symptoms in detail. I agree with her findings, examination as well as impression recommendations as per our discussion.    Very pleasant & healthy 40 y/o man presenting with SSx of near syncope.  Sx sound somewhat concerning for TIA, but he has been see by Neuro & this was thought that TIA was unlikely.  He does have a FH of CVA with his father & his brother with recent Dx of CAD -CABG.  My suspicion is that this episode was related to a combination of mild dehydration combined with him being on a vasodilator (enalopril 10 mg).  He also noted some nausea symptoms prior to almost passing out.  We need to exclude an arrhythmogenic episode, although unlikely without palpitations. With his FH of CAD, I feel that it is prudent to exclude an ischemic etiology with a stress test as well as any potential cardiac structural abnormality with an Echocardiogram.  Recommend: OP 30d event monitor OP Treadmill Stress Echo & full 2 D Echo OK to return to work - he is not back on until Thursday. Ensure adequate hydration  Take enalapril at lunch time  Will arrange OP f/u after studies complete.Glenetta Hew, M.D., M.S. Interventional Cardiologist   Pager # [phone] Phone # [phone] [address]  [place], [place] [address]

## 2016-03-31 NOTE — ED Notes (Signed)
Gave pt [place] sandwich, per Martie Round - RN.

## 2016-03-31 NOTE — ED Notes (Signed)
Cardiology rounding at bedside.

## 2016-03-31 NOTE — ED Provider Notes (Signed)
[place] DEPT Provider Note   CSN: [id number] Arrival date & time: 03/31/16  0820     History   Chief Complaint Chief Complaint  Patient presents with  . Near Syncope    HPI Nathaniel Reed is a 40 y.o. male.  HPI   Nathaniel Reed is a 40 y.o. male, with a history of HTN, presenting to the ED with a syncopal episode. 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 Did not do any work on scene. Went back to bed. Woke up at 7 AM. Went to brush his teeth and had sudden onset of "lack of coordination." Pt states he felt like he was unsteady and weak. Had difficulty grabbing his toothbrush. Was able to "stagger" to the sink.  Laid down, but still felt weak, unsteady, and nauseous; lightheaded. States his vision then closed in and went black. Was pale and diaphoretic when EMS arrived. Last known normal was around 5 AM this morning. Wife at the bedside states pt called her and states that he seemed to have trouble speaking. States she thought he was having trouble forming words.  Was using the right words, but words came more slowly like he had to think about each word. Also states that there may have been an increase in patient's fatigability over the last three months. Denies falls or head trauma. Denies vomiting, fever/chills, recent illness, chest pain, shortness of breath, or any other complaints.    Past Medical History:  Diagnosis Date  . Acid reflux   . Hypertension     Patient Active Problem List   Diagnosis Date Noted  . Essential hypertension 08/12/2015    History reviewed. No pertinent surgical history.     Home Medications    Prior to Admission medications   Medication Sig Start Date End Date Taking? Authorizing Provider  cetirizine (ZYRTEC) 10 MG tablet Take 10 mg by mouth daily as needed for allergies. Reported on 08/08/2015   Yes Historical Provider, MD  enalapril (VASOTEC) 10 MG tablet Take 1 tablet by mouth  daily 02/10/16  Yes Kathyrn Drown,  MD  fluticasone (FLONASE) 50 MCG/ACT nasal spray Place into both nostrils daily.   Yes Historical Provider, MD  ibuprofen (ADVIL,MOTRIN) 200 MG tablet Take 200 mg by mouth every 6 (six) hours as needed for mild pain.   Yes Historical Provider, MD  omeprazole (PRILOSEC) 20 MG capsule Take by mouth daily as needed (for acid reflux).    Yes Historical Provider, MD    Family History Family History  Problem Relation Age of Onset  . Stroke Father   . Hypertension Father   . Heart disease Brother     Social History Social History  Substance Use Topics  . Smoking status: Never Smoker  . Smokeless tobacco: Never Used  . Alcohol use No     Allergies   Review of patient's allergies indicates no known allergies.   Review of Systems Review of Systems  Constitutional: Positive for diaphoresis. Negative for chills and fever.  Respiratory: Negative for shortness of breath.   Cardiovascular: Negative for chest pain.  Gastrointestinal: Positive for nausea. Negative for abdominal pain and vomiting.  Skin: Positive for pallor.  Neurological: Positive for syncope, weakness and light-headedness. Negative for numbness.  All other systems reviewed and are negative.    Physical Exam Updated Vital Signs BP 142/86 (BP Location: Right Arm)   Pulse (!) 55   Temp 98.5 F (36.9 C) (Oral)   Resp 16  Ht 5\' 11"  (1.803 m)   Wt 102.1 kg   SpO2 98%   BMI 31.38 kg/m   Physical Exam  Constitutional: He is oriented to person, place, and time. He appears well-developed and well-nourished. No distress.  HENT:  Head: Normocephalic and atraumatic.  Eyes: Conjunctivae and EOM are normal. Pupils are equal, round, and reactive to light.  Neck: Neck supple.  Cardiovascular: Normal rate, regular rhythm, normal heart sounds and intact distal pulses.   Pulmonary/Chest: Effort normal and breath sounds normal. No respiratory distress.  Abdominal: Soft. There is no tenderness. There is no guarding.    Musculoskeletal: Normal range of motion. He exhibits no edema.  Full ROM in all extremities and spine. No midline spinal tenderness.   Lymphadenopathy:    He has no cervical adenopathy.  Neurological: He is alert and oriented to person, place, and time. He has normal reflexes.  No sensory deficits. Strength 5/5 in all extremities, but weaker than expected. No gait disturbance. Coordination intact. Cranial nerves III-XII grossly intact. No facial droop.   Skin: Skin is warm and dry. He is not diaphoretic.  Psychiatric: He has a normal mood and affect. His behavior is normal.  Nursing note and vitals reviewed.    ED Treatments / Results  Labs (all labs ordered are listed, but only abnormal results are displayed) Labs Reviewed  BASIC METABOLIC PANEL - Abnormal; Notable for the following:       Result Value   Glucose, Bld 102 (*)    All other components within normal limits  CBC - Abnormal; Notable for the following:    MCHC 36.1 (*)    Platelets 141 (*)    All other components within normal limits  HEPATIC FUNCTION PANEL - Abnormal; Notable for the following:    Total Protein 6.4 (*)    All other components within normal limits  URINALYSIS, ROUTINE W REFLEX MICROSCOPIC (NOT AT [facility])  MAGNESIUM  I-STAT TROPOININ, ED  I-STAT TROPOININ, ED    EKG  EKG Interpretation  Date/Time:  Tuesday March 31 2016 08:32:59 EDT Ventricular Rate:  49 PR Interval:    QRS Duration: 90 QT Interval:  427 QTC Calculation: 386 R Axis:   25 Text Interpretation:  Sinus bradycardia no STEMI. POOR R WAVE PROGRESSION Confirmed by Johnney Killian, MD, Jeannie Done [phone]) on 03/31/2016 9:48:20 AM       Radiology Dg Chest 2 View  Result Date: 03/31/2016 CLINICAL DATA:  Near syncope EXAM: CHEST  2 VIEW COMPARISON:  None. FINDINGS: The heart size and mediastinal contours are within normal limits. Both lungs are clear. The visualized skeletal structures are unremarkable. IMPRESSION: No active cardiopulmonary  disease. Electronically Signed   By: Franchot Gallo M.D.   On: 03/31/2016 09:49   Ct Head Wo Contrast  Result Date: 03/31/2016 CLINICAL DATA:  Acute onset dizziness.  Near syncope EXAM: CT HEAD WITHOUT CONTRAST TECHNIQUE: Contiguous axial images were obtained from the base of the skull through the vertex without intravenous contrast. COMPARISON:  None. FINDINGS: Brain: Ventricle size normal. Cerebral volume normal. Negative for acute or chronic infarction. Negative for hemorrhage or mass. No shift of the midline structures. Vascular: Negative Skull: Negative Sinuses/Orbits: Negative Other: Negative IMPRESSION: Negative Electronically Signed   By: Franchot Gallo M.D.   On: 03/31/2016 09:42    Procedures Procedures (including critical care time)  Medications Ordered in ED Medications  sodium chloride 0.9 % bolus 1,000 mL (0 mLs Intravenous Stopped 03/31/16 1208)  ondansetron (ZOFRAN) injection 4 mg (4 mg  Intravenous Given 03/31/16 0912)  0.9 %  sodium chloride infusion ( Intravenous Stopped 03/31/16 1338)    Orthostatic VS for the past 24 hrs:  BP- Sitting Pulse- Sitting BP- Standing at 0 minutes Pulse- Standing at 0 minutes  03/31/16 0837 (!) 133/93 54 (!) 138/95 68      Initial Impression / Assessment and Plan / ED Course  I have reviewed the triage vital signs and the nursing notes.  Pertinent labs & imaging results that were available during my care of the patient were reviewed by me and considered in my medical decision making (see chart for details).  Clinical Course    Kendarrius Waldeck presents with a near syncopal episode beginning suddenly this morning.  Findings and plan of care discussed with Charlesetta Shanks, MD. Dr. Johnney Killian personally evaluated and examined this patient.  Patient's presentation concerning for cardiac versus neurologic source. Patient is nontoxic appearing, but noted to be bradycardic. Patient's bradycardia could be baseline for him, as he is in very good  physical condition, however, patient does not know for sure. Neurology and cardiology consult. I believe that it is significant that the patient had this episode. He has never had such an episode. I also believe it to be significant that the patient felt poorly enough and was frightened enough by the episode that he asked his teammate to call EMS.  Evaluation by cardiology and neurologist. Pt cleared for discharge. Strict return precautions discussed. Patient voices understanding of all instructions and is comfortable with discharge. Pt symptom-free upon discharge.   See below for detailed patient care timeline: 9:30 AM Spoke with Dr. Tasia Catchings, Neurologist, who agreed to come evaluate the patient. 10:32 AM Spoke with Trish from cardiology who states they will come see the patient in the ED.  3:40 PM Reino Bellis, NP for cardiology, evaluated the patient. Dr. Ellyn Hack, cardiology, also evaluated the patient. States pt can go home from their standpoint. They do not think he needs to be admitted. Scheduled the patient for a stress test on September 6. Patient be fitted with a Holter monitor tomorrow. They state that they think the patient can go back to full duty at work 2 days.  Dr. Johnney Killian, nor I, believe the patient should be discharged at this time. It is our opinion that the patient's event was significant enough to warrant an attempt at observation overnight. We also believe he should be on light duty at work until he is cleared by the cardiologist following the stress echo and Holter monitor. 4:30 PM Spoke with Dr. Hartford Poli, hospitalist, who states that he thinks that we have already done everything that needs to be done in the hospital. Advises that since neurology and cardiology have already cleared the patient for outpatient follow up that he does not think there would be value in admitting the patient.  This was discussed with the patient along with strict return precautions. Patient is to be  placed on light duty until cleared by the cardiologist following the stress echo. Patient is scheduled for an appointment to receive his Holter monitor tomorrow at 11:30 AM. Stress echo scheduled for September 6 at 7:30 AM. Appointment with Dr. Ellyn Hack on September 12 at 9 AM.    Vitals:   03/31/16 [id number] 03/31/16 0828 03/31/16 0835  BP:   142/86  Pulse:   (!) 55  Resp:   16  Temp:   98.5 F (36.9 C)  TempSrc:   Oral  SpO2: 96%  98%  Weight:  102.1  kg   Height:  5\' 11"  (1.803 m)    Vitals:   03/31/16 1400 03/31/16 1415 03/31/16 1430 03/31/16 1445  BP: 116/81 120/84 108/73 120/84  Pulse: (!) 50 (!) 54 (!) 51 (!) 57  Resp: 16 17 14 18   Temp:      TempSrc:      SpO2: 98% 99% 99% 99%  Weight:      Height:       Vitals:   03/31/16 1415 03/31/16 1430 03/31/16 1445 03/31/16 1500  BP: 120/84 108/73 120/84 114/85  Pulse: (!) 54 (!) 51 (!) 57 (!) 48  Resp: 17 14 18 14   Temp:      TempSrc:      SpO2: 99% 99% 99% 96%  Weight:      Height:          Final Clinical Impressions(s) / ED Diagnoses   Final diagnoses:  Near syncope  Bradycardia    New Prescriptions New Prescriptions   No medications on file     Lorayne Bender, PA-C 03/31/16 Mabel, PA-C 03/31/16 1654    Charlesetta Shanks, MD 04/04/16 1453

## 2016-03-31 NOTE — Discharge Instructions (Signed)
You have been seen today for a near syncopal episode. You were found to be bradycardic, but we do not know if this is new for you. You will need to follow up with cardiology multiple times over the next few weeks. Please refer to the information above for specific dates and times.  It is very important that you adhere to your workplace restrictions. You should also have a low threshold for returning to the ED. Should you feel abnormal at all, please return to the ED.

## 2016-03-31 NOTE — Consult Note (Signed)
Initial Neurological Consultation                      NEURO HOSPITALIST CONSULT NOTE   Requestig physician: Dr. Johnney Killian   Reason for Consult:  Dizziness and brief syncopal event.   HPI:                                                                                                                                          Nathaniel Reed is an 40 y.o. male who reports that he is in good health but for the past 2 months has been experiencing a sensation of general fatigue. He reports that it was more notable this morning. He reports that when getting out of bed he felt a bit dizzy and somewhat diaphoretic. He went to the bathroom to lie down and feels that he had a brief syncopal event area he feels improved at this time. He continues to complain of some mild dizziness. He does not have any other focal neurological complaints. His CT of the brain was just completed and was unremarkable.  Past Medical History:  Diagnosis Date  . Acid reflux   . Hypertension     History reviewed. No pertinent surgical history.  MEDICATIONS:                                                                                                                     I have reviewed the patient's current medications.  No Known Allergies   Social History:  reports that he has never smoked. He has never used smokeless tobacco. He reports that he does not drink alcohol or use drugs.  Family History  Problem Relation Age of Onset  . Stroke Father   . Hypertension Father   . Heart disease Brother      ROS:  History obtained from chart review  General ROS: negative for - chills, fatigue, fever, night sweats, weight gain or weight loss Psychological ROS: negative for - behavioral disorder, hallucinations, memory difficulties, mood swings or suicidal ideation Ophthalmic ROS:  negative for - blurry vision, double vision, eye pain or loss of vision ENT ROS: negative for - epistaxis, nasal discharge, oral lesions, sore throat, tinnitus or vertigo Allergy and Immunology ROS: negative for - hives or itchy/watery eyes Hematological and Lymphatic ROS: negative for - bleeding problems, bruising or swollen lymph nodes Endocrine ROS: negative for - galactorrhea, hair pattern changes, polydipsia/polyuria or temperature intolerance Respiratory ROS: negative for - cough, hemoptysis, shortness of breath or wheezing Cardiovascular ROS: negative for - chest pain, dyspnea on exertion, edema or irregular heartbeat Gastrointestinal ROS: negative for - abdominal pain, diarrhea, hematemesis, nausea/vomiting or stool incontinence Genito-Urinary ROS: negative for - dysuria, hematuria, incontinence or urinary frequency/urgency Musculoskeletal ROS: negative for - joint swelling or muscular weakness Neurological ROS: as noted in HPI Dermatological ROS: negative for rash and skin lesion changes   General Exam                                                                                                      Blood pressure 136/97, pulse (!) 54, temperature 98.5 F (36.9 C), temperature source Oral, resp. rate 21, height 5\' 11"  (1.803 m), weight 102.1 kg (225 lb), SpO2 100 %. HEENT-  Normocephalic, no lesions, without obvious abnormality.  Normal external eye and conjunctiva.  Normal TM's bilaterally.  Normal auditory canals and external ears. Normal external nose, mucus membranes and septum.  Normal pharynx. Cardiovascular- regular rate and rhythm, S1, S2 normal, no murmur, click, rub or gallop, pulses palpable throughout   Lungs- chest clear, no wheezing, rales, normal symmetric air entry, Heart exam - S1, S2 normal, no murmur, no gallop, rate regular Abdomen- soft, non-tender; bowel sounds normal; no masses,  no organomegaly Extremities- less then 2 second capillary refill Lymph-no  adenopathy palpable Musculoskeletal-no joint tenderness, deformity or swelling Skin-warm and dry, no hyperpigmentation, vitiligo, or suspicious lesions  Neurological Examination Mental Status: Alert, oriented, thought content appropriate.  Speech fluent without evidence of aphasia.  Able to follow 3 step commands without difficulty. Cranial Nerves: II: Discs flat bilaterally; Visual fields grossly normal, pupils equal, round, reactive to light and accommodation III,IV, VI: ptosis not present, extra-ocular motions intact bilaterally V,VII: smile symmetric, facial light touch sensation normal bilaterally VIII: hearing normal bilaterally IX,X: uvula rises symmetrically XI: bilateral shoulder shrug XII: midline tongue extension Motor: Right : Upper extremity   5/5    Left:     Upper extremity   5/5  Lower extremity   5/5     Lower extremity   5/5 Tone and bulk:normal tone throughout; no atrophy noted Sensory: Pinprick and light touch intact throughout, bilaterally Deep Tendon Reflexes: 2+ and symmetric throughout Plantars: Right: downgoing   Left: downgoing Cerebellar: normal finger-to-nose, normal rapid alternating movements and normal heel-to-shin test Gait: normal gait and station      Lab Results: Basic Metabolic Panel:  Recent  Labs Lab 03/31/16 0844  NA 140  K 4.0  CL 109  CO2 25  GLUCOSE 102*  BUN 17  CREATININE 1.13  CALCIUM 9.0    Liver Function Tests: No results for input(s): AST, ALT, ALKPHOS, BILITOT, PROT, ALBUMIN in the last 168 hours. No results for input(s): LIPASE, AMYLASE in the last 168 hours. No results for input(s): AMMONIA in the last 168 hours.  CBC:  Recent Labs Lab 03/31/16 0844  WBC 5.4  HGB 16.1  HCT 44.6  MCV 90.8  PLT 141*    Cardiac Enzymes: No results for input(s): CKTOTAL, CKMB, CKMBINDEX, TROPONINI in the last 168 hours.  Lipid Panel: No results for input(s): CHOL, TRIG, HDL, CHOLHDL, VLDL, LDLCALC in the last 168  hours.  CBG: No results for input(s): GLUCAP in the last 168 hours.  Microbiology: No results found for this or any previous visit.  Coagulation Studies: No results for input(s): LABPROT, INR in the last 72 hours.  Imaging: Dg Chest 2 View  Result Date: 03/31/2016 CLINICAL DATA:  Near syncope EXAM: CHEST  2 VIEW COMPARISON:  None. FINDINGS: The heart size and mediastinal contours are within normal limits. Both lungs are clear. The visualized skeletal structures are unremarkable. IMPRESSION: No active cardiopulmonary disease. Electronically Signed   By: Franchot Gallo M.D.   On: 03/31/2016 09:49   Ct Head Wo Contrast  Result Date: 03/31/2016 CLINICAL DATA:  Acute onset dizziness.  Near syncope EXAM: CT HEAD WITHOUT CONTRAST TECHNIQUE: Contiguous axial images were obtained from the base of the skull through the vertex without intravenous contrast. COMPARISON:  None. FINDINGS: Brain: Ventricle size normal. Cerebral volume normal. Negative for acute or chronic infarction. Negative for hemorrhage or mass. No shift of the midline structures. Vascular: Negative Skull: Negative Sinuses/Orbits: Negative Other: Negative IMPRESSION: Negative Electronically Signed   By: Franchot Gallo M.D.   On: 03/31/2016 09:42    Assessment/Plan:  Ranon is a pleasant 40 year old patient who presents with a history of a general sense of some fatigue but has been present for the past 2 months. He feels it has been more notable over the past few days. He reports a syncopal event that occurred this morning. His neurologic exam is normal. His CT of the brain is also normal. There is no evidence of a neuropathy or myopathy on exam. There is no evidence of a neuromuscular junction disorder either. He reports in the past week he has been taking biotin as a supplement. However, he reports taking no other supplements. His laboratory studies have been reviewed and are unremarkable.  At this time there is no clear neurological  explanation for his symptoms. Generalized fatigue can be seen in a number of medical conditions. Another consideration would be the possibility of a condition such as sleep apnea. Rayland reports that he frequently has some difficulty obtaining restful sleep at night. This could be pursued on an outpatient basis. Further evaluations for cardiovascular etiologies of syncope may be of some benefit as well.  Plan:  1. Consider outpatient sleep study.  2. Neurology will sign off at this Reed please reconsult with any new issues.    James A. Tasia Catchings, M.D. Neurohospitalist Phone: [phone]  03/31/2016, 10:03 AM

## 2016-03-31 NOTE — ED Triage Notes (Signed)
Pt. BIB [facility] for evaluation of near syncopal event this AM. Pt. Is Set designer working over night shift. Pt. States had call around 4 AM and felt fine. Pt. States he immediately felt dizzy and diaphoretic on standing out of bed this AM. Pt. States he did have normal bowel movement upon waking up. SB on the monitor, states is very athletic and believes is his  Baseline.

## 2016-03-31 NOTE — ED Notes (Signed)
Pt. States he does not feel like he can provide a urine specimen at this time.

## 2016-04-01 ENCOUNTER — Ambulatory Visit (INDEPENDENT_AMBULATORY_CARE_PROVIDER_SITE_OTHER): Payer: Commercial Managed Care - HMO

## 2016-04-01 ENCOUNTER — Telehealth: Payer: Self-pay | Admitting: Cardiology

## 2016-04-01 ENCOUNTER — Ambulatory Visit: Payer: Commercial Managed Care - HMO | Admitting: Family Medicine

## 2016-04-01 ENCOUNTER — Encounter: Payer: Self-pay | Admitting: *Deleted

## 2016-04-01 DIAGNOSIS — R55 Syncope and collapse: Secondary | ICD-10-CM

## 2016-04-01 NOTE — Telephone Encounter (Signed)
New Message  Pt voiced he was seen in ED 8.22.17 by MD Ellyn Hack.  Pt voiced he's needing a letter stating he's able to return back to work.  Pt voiced MD Ellyn Hack stated everything was fine with pt but he needs it in writing so he can return to work.  Please follow up with pt. Thanks!

## 2016-04-01 NOTE — Telephone Encounter (Signed)
Reviewed ED notes. Dr. Ellyn Hack included specific wording in his ED note that the patient is OK to return to work. I have put together a letter w/ stamped signature.  Patient aware this will be at front desk and will come today to pick up. He is aware he may call if any further questions or concerns. I have offered to speak to him in person should he have any other needs when he arrives to office. I have recommended he continue with all ordered tests - pt voiced acknowledgment -- notes he got his event monitor placed today.  Pt voiced thanks for assistance and understanding of all instructions.

## 2016-04-02 ENCOUNTER — Telehealth (HOSPITAL_COMMUNITY): Payer: Self-pay | Admitting: *Deleted

## 2016-04-02 NOTE — Telephone Encounter (Signed)
Patient given detailed instructions per Stress Test Requisition Sheet for test on 04/07/16 at 2:30.Patient Notified to arrive 30 minutes early, and that it is imperative to arrive on time for appointment to keep from having the test rescheduled.  Patient verbalized understanding. Veronia Beets

## 2016-04-03 NOTE — Telephone Encounter (Signed)
Thank you for doing the letter. Can include copy of my note  Glenetta Hew, MD

## 2016-04-07 ENCOUNTER — Ambulatory Visit (HOSPITAL_BASED_OUTPATIENT_CLINIC_OR_DEPARTMENT_OTHER): Payer: Worker's Compensation

## 2016-04-07 ENCOUNTER — Other Ambulatory Visit (HOSPITAL_COMMUNITY): Payer: Self-pay

## 2016-04-07 ENCOUNTER — Ambulatory Visit (HOSPITAL_COMMUNITY): Payer: Worker's Compensation | Attending: Cardiology

## 2016-04-07 DIAGNOSIS — R55 Syncope and collapse: Secondary | ICD-10-CM

## 2016-04-07 DIAGNOSIS — R0989 Other specified symptoms and signs involving the circulatory and respiratory systems: Secondary | ICD-10-CM

## 2016-04-09 ENCOUNTER — Ambulatory Visit (INDEPENDENT_AMBULATORY_CARE_PROVIDER_SITE_OTHER): Payer: Commercial Managed Care - HMO | Admitting: Family Medicine

## 2016-04-09 ENCOUNTER — Encounter: Payer: Self-pay | Admitting: Family Medicine

## 2016-04-09 ENCOUNTER — Telehealth: Payer: Self-pay | Admitting: Cardiology

## 2016-04-09 VITALS — BP 116/84 | HR 60 | Ht 71.0 in | Wt 221.0 lb

## 2016-04-09 DIAGNOSIS — R55 Syncope and collapse: Secondary | ICD-10-CM

## 2016-04-09 DIAGNOSIS — D696 Thrombocytopenia, unspecified: Secondary | ICD-10-CM | POA: Diagnosis not present

## 2016-04-09 DIAGNOSIS — I951 Orthostatic hypotension: Secondary | ICD-10-CM | POA: Diagnosis not present

## 2016-04-09 DIAGNOSIS — I10 Essential (primary) hypertension: Secondary | ICD-10-CM

## 2016-04-09 MED ORDER — ENALAPRIL MALEATE 10 MG PO TABS: 10.0000 mg | ORAL_TABLET | Freq: Every day | ORAL | 1 refills | Status: DC

## 2016-04-09 NOTE — Telephone Encounter (Signed)
Returned call to patient - notified of results, he voiced understanding and thanks. Also states he had a question about wearing the monitor and whether he needed to continue. We discussed that the monitor will pick up potential arrhythmia events and that these are typically not found w the types of tests he has already had.  He asked if he could turn in monitor a few days early (4-5 days) since he is going to the beach around that time. Advised OK but recommended if he wanted to utilize for full 30 days to notify company and see if any potential concerns w/ delay/interruption in service. Pt voiced understanding and thanks for call.

## 2016-04-09 NOTE — Progress Notes (Signed)
   Subjective:    Patient ID: Nathaniel Reed, male    DOB: 1976/07/31, 40 y.o.   MRN: [id number]  HPIFollow up ED visit for near syncope on 8/22. Wearing heart monitor now. Did Echo stress test on 8/29.  Needs refill on enalapril. Pt states cardiologist told him to take at 12 instead of night time. 116 84  Entire hospital record reviewed. Including consult notes from neurologist cardiologist and the ER physician  Patient had had a very difficult day and night. Likely got somewhat dehydrated. Jumped up out of bed. Experienced a near syncopal event.  After that it went to tremendous lengths in terms of workup at the emergency room. Please see all ER reports.  40 minutes today spent mostly in discussion and review of this complicated presentation. Next  Patient was worried about his low heart rate. Also worried about potential for coronary artery disease  Review of Systems No headache, no major weight loss or weight gain, no chest pain no back pain abdominal pain no change in bowel habits complete ROS otherwise negative     Objective:   Physical Exam   Alert vitals stable, NAD. Blood pressure good on repeat. HEENT normal. Lungs clear. Heart regular rate and rhythm.      Assessment & Plan:  Impression near syncope likely secondary to transient orthostatic hypotension which in turn kicked in a vasovagal response. This would explain all of the patient's symptoms. Stress echo fortunately has return normal as anticipated. For now would recommend same blood pressure pill. If these events continue would back off on the ACE inhibitor and add perhaps a low dose calcium channel blocker or other agent if necessary. Diet exercise discussed. Warning signs discussed check every 6 months check CBC to ascertain platelet count it was borderline low in the hospital 40 minutes spent most in discussion

## 2016-04-09 NOTE — Telephone Encounter (Signed)
New message   Pt verbalized that he is calling for echo results

## 2016-04-10 LAB — CBC WITH DIFFERENTIAL/PLATELET
BASOS ABS: 0 10*3/uL (ref 0.0–0.2)
BASOS: 0 %
EOS (ABSOLUTE): 0.2 10*3/uL (ref 0.0–0.4)
Eos: 4 %
HEMOGLOBIN: 16.9 g/dL (ref 12.6–17.7)
Hematocrit: 47.3 % (ref 37.5–51.0)
IMMATURE GRANS (ABS): 0 10*3/uL (ref 0.0–0.1)
Immature Granulocytes: 0 %
LYMPHS: 42 %
Lymphocytes Absolute: 2.1 10*3/uL (ref 0.7–3.1)
MCH: 32.6 pg (ref 26.6–33.0)
MCHC: 35.7 g/dL (ref 31.5–35.7)
MCV: 91 fL (ref 79–97)
MONOCYTES: 9 %
Monocytes Absolute: 0.4 10*3/uL (ref 0.1–0.9)
NEUTROS ABS: 2.2 10*3/uL (ref 1.4–7.0)
Neutrophils: 45 %
Platelets: 180 10*3/uL (ref 150–379)
RBC: 5.18 x10E6/uL (ref 4.14–5.80)
RDW: 12.3 % (ref 12.3–15.4)
WBC: 5 10*3/uL (ref 3.4–10.8)

## 2016-04-15 ENCOUNTER — Other Ambulatory Visit (HOSPITAL_COMMUNITY): Payer: Self-pay

## 2016-04-21 ENCOUNTER — Ambulatory Visit: Payer: Self-pay | Admitting: Cardiology

## 2016-05-15 ENCOUNTER — Ambulatory Visit: Payer: Commercial Managed Care - HMO | Admitting: Family Medicine

## 2016-07-07 ENCOUNTER — Other Ambulatory Visit: Payer: Self-pay | Admitting: Family Medicine

## 2016-10-06 ENCOUNTER — Ambulatory Visit (INDEPENDENT_AMBULATORY_CARE_PROVIDER_SITE_OTHER): Payer: Commercial Managed Care - HMO | Admitting: Family Medicine

## 2016-10-06 ENCOUNTER — Encounter: Payer: Self-pay | Admitting: Family Medicine

## 2016-10-06 ENCOUNTER — Other Ambulatory Visit: Payer: Self-pay | Admitting: *Deleted

## 2016-10-06 VITALS — BP 122/84 | Ht 71.0 in | Wt 222.5 lb

## 2016-10-06 DIAGNOSIS — I10 Essential (primary) hypertension: Secondary | ICD-10-CM | POA: Diagnosis not present

## 2016-10-06 MED ORDER — CLARITHROMYCIN 500 MG PO TABS: 500.0000 mg | ORAL_TABLET | Freq: Two times a day (BID) | ORAL | 0 refills | Status: DC

## 2016-10-06 MED ORDER — ENALAPRIL MALEATE 10 MG PO TABS: 10.0000 mg | ORAL_TABLET | Freq: Every day | ORAL | 1 refills | Status: DC

## 2016-10-06 NOTE — Progress Notes (Signed)
   Subjective:    Patient ID: Nathaniel Reed, male    DOB: [birth year], 41 y.o.   MRN: [id number]  Hypertension  This is a chronic problem. The current episode started more than 1 year ago. There are no compliance problems.    Patient also has c/o chest congestion, fatigue  and sore throat. Onset last Wednesday. Has tried increasing fluids, and oral decongestants. 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 Compliant with blood pressure medicine. States does not miss a dose. No obvious side effects. Blood pressure generally good when checked elsewhere. Watching salt intake. Takes med faithfully  Exercise not sas much lately    Review of Systems No headache, no major weight loss or weight gain, no chest pain no back pain abdominal pain no change in bowel habits complete ROS otherwise negative     Objective:   Physical Exam Alert, mild malaise. Hydration good Vitals stable. frontal/ maxillary tenderness evident positive nasal congestion. pharynx normal neck supple  lungs clear/no crackles or wheezes. heart regular in rhythm        Assessment & Plan:  Impression 1 rhinosinusitis/bronchitis #2 hypertension good control discussed plan diet exercise discussed. Continue same medications. Add antibiotic. WSL

## 2016-10-06 NOTE — Addendum Note (Signed)
Addended by: Dairl Ponder on: 10/06/2016 10:51 AM   Modules accepted: Orders

## 2016-10-15 ENCOUNTER — Ambulatory Visit: Payer: Commercial Managed Care - HMO | Admitting: Family Medicine

## 2016-11-05 ENCOUNTER — Ambulatory Visit
Admission: RE | Admit: 2016-11-05 | Discharge: 2016-11-05 | Disposition: A | Discharge status: Home / Self Care | Payer: Worker's Compensation | Source: Ambulatory Visit | Attending: Nurse Practitioner | Admitting: Nurse Practitioner

## 2016-11-05 ENCOUNTER — Other Ambulatory Visit: Payer: Self-pay | Admitting: Nurse Practitioner

## 2016-11-05 DIAGNOSIS — R52 Pain, unspecified: Secondary | ICD-10-CM

## 2016-11-05 DIAGNOSIS — R609 Edema, unspecified: Secondary | ICD-10-CM

## 2016-11-05 DIAGNOSIS — T148XXA Other injury of unspecified body region, initial encounter: Secondary | ICD-10-CM

## 2017-01-25 ENCOUNTER — Ambulatory Visit (INDEPENDENT_AMBULATORY_CARE_PROVIDER_SITE_OTHER): Payer: 59 | Admitting: Family Medicine

## 2017-01-25 ENCOUNTER — Encounter: Payer: Self-pay | Admitting: Family Medicine

## 2017-01-25 VITALS — BP 110/80 | Temp 98.1°F | Ht 71.0 in | Wt 223.1 lb

## 2017-01-25 DIAGNOSIS — J329 Chronic sinusitis, unspecified: Secondary | ICD-10-CM | POA: Diagnosis not present

## 2017-01-25 DIAGNOSIS — J209 Acute bronchitis, unspecified: Secondary | ICD-10-CM

## 2017-01-25 MED ORDER — AMOXICILLIN-POT CLAVULANATE 875-125 MG PO TABS: 1.0000 | ORAL_TABLET | Freq: Two times a day (BID) | ORAL | 0 refills | Status: AC

## 2017-01-25 NOTE — Progress Notes (Signed)
   Subjective:    Patient ID: Nathaniel Reed, male    DOB: 31-Jul-1976, 41 y.o.   MRN: [id number]  Cough  This is a new problem. The current episode started in the past 7 days. Associated symptoms include headaches. Associated symptoms comments: Nausea, Fatigue .   Patient states no concerns this visit.   Yellow gunkiness  bac cough   Productive  Dim energy and dim appetitie  Keeping up on gatorade  Dim  Appetite  Pt had left over biaxin but did not take any  Felt chills last night   achey off and on   Feels tigh at the post part of the neck   Review of Systems  Respiratory: Positive for cough.   Neurological: Positive for headaches.       Objective:   Physical Exam  Alert, mild malaise. Hydration good Vitals stable. frontal/ maxillary tenderness evident positive nasal congestion. pharynx normal neck supple  lungs clear/no crackles or wheezes. heart regular in rhythm       Assessment & Plan:  Impression rhinosinusitis likely post viral, discussed with patient. plan antibiotics prescribed. Questions answered. Symptomatic care discussed. warning signs discussed. WSL Testing post viral, wife had viral prodrome also, plus bronchitis aspect

## 2017-01-26 ENCOUNTER — Other Ambulatory Visit: Payer: Self-pay | Admitting: *Deleted

## 2017-01-26 ENCOUNTER — Telehealth: Payer: Self-pay | Admitting: Family Medicine

## 2017-01-26 MED ORDER — CLARITHROMYCIN 500 MG PO TABS: 500.0000 mg | ORAL_TABLET | Freq: Two times a day (BID) | ORAL | 0 refills | Status: DC

## 2017-01-26 NOTE — Telephone Encounter (Signed)
Patient was diagnosed yesterday with rhinosinusitis by Dr. Richardson Landry.  He said he has gotten progressively worse, running fevers of 101.  He wants to know what Dr. Richardson Landry advises him to do.

## 2017-01-26 NOTE — Telephone Encounter (Signed)
Discussed with pt. Med sent to pharm.  

## 2017-01-26 NOTE — Telephone Encounter (Signed)
Pt calls back today feeling worse than he did while here yesterday. Per pt he was given Amoxicillin and told to take a decongestant.He is now running a temp of 99.9-101.6. He feels achy,headache,nausea, fever,chills. Has been in the bed all day. Per Dr. Richardson Landry change antibiotic to Biaxin 500 mg BID for 10 days. Likely viral, will likely feel bad on any antibiotic, but will switch to be safe.

## 2017-03-19 ENCOUNTER — Other Ambulatory Visit: Payer: Self-pay | Admitting: Family Medicine

## 2017-04-07 ENCOUNTER — Encounter: Payer: Commercial Managed Care - HMO | Admitting: Family Medicine

## 2017-04-08 ENCOUNTER — Encounter: Payer: Self-pay | Admitting: Family Medicine

## 2017-04-08 ENCOUNTER — Ambulatory Visit (INDEPENDENT_AMBULATORY_CARE_PROVIDER_SITE_OTHER): Payer: 59 | Admitting: Family Medicine

## 2017-04-08 VITALS — BP 112/70 | Ht 71.0 in | Wt 222.0 lb

## 2017-04-08 DIAGNOSIS — I10 Essential (primary) hypertension: Secondary | ICD-10-CM

## 2017-04-08 DIAGNOSIS — Z0000 Encounter for general adult medical examination without abnormal findings: Secondary | ICD-10-CM

## 2017-04-08 MED ORDER — ENALAPRIL MALEATE 10 MG PO TABS: 10.0000 mg | ORAL_TABLET | Freq: Every day | ORAL | 1 refills | Status: DC

## 2017-04-08 NOTE — Progress Notes (Signed)
Subjective:    Patient ID: Nathaniel Reed, male    DOB: 1975/11/25, 41 y.o.   MRN: [id number]  HPI The patient comes in today for a wellness visit.    A review of their health history was completed.  A review of medications was also completed.  Any needed refills; none today  Eating habits: not health conscious  Falls/  MVA accidents in past few months: none  Regular exercise: treadmill - cross fit  Specialist pt sees on regular basis: none  Preventative health issues were discussed.   Additional concerns: none  Results for orders placed or performed in visit on 04/09/16  CBC with Differential/Platelet  Result Value Ref Range   WBC 5.0 3.4 - 10.8 x10E3/uL   RBC 5.18 4.14 - 5.80 x10E6/uL   Hemoglobin 16.9 12.6 - 17.7 g/dL   Hematocrit 47.3 37.5 - 51.0 %   MCV 91 79 - 97 fL   MCH 32.6 26.6 - 33.0 pg   MCHC 35.7 31.5 - 35.7 g/dL   RDW 12.3 12.3 - 15.4 %   Platelets 180 150 - 379 x10E3/uL   Neutrophils 45 %   Lymphs 42 %   Monocytes 9 %   Eos 4 %   Basos 0 %   Neutrophils Absolute 2.2 1.4 - 7.0 x10E3/uL   Lymphocytes Absolute 2.1 0.7 - 3.1 x10E3/uL   Monocytes Absolute 0.4 0.1 - 0.9 x10E3/uL   EOS (ABSOLUTE) 0.2 0.0 - 0.4 x10E3/uL   Basophils Absolute 0.0 0.0 - 0.2 x10E3/uL   Immature Granulocytes 0 %   Immature Grans (Abs) 0.0 0.0 - 0.1 x10E3/uL   BP numbers geernally 120s over 70s   Blood pressure medicine and blood pressure levels reviewed today with patient. Compliant with blood pressure medicine. States does not miss a dose. No obvious side effects. Blood pressure generally good when checked elsewhere. Watching salt intake. Takes eve  eigth o clock   No fam hx of prost or colon cancer  psa did last yr  Doing t mill and sig walking   Able to exrcise at work   Should er aching at times not working it as hard  Has noted progressive fatigue and tiredness over the last, notes when he drinks sig water oes not feel as bad, 7 or 8 months  Not feling the  best just plainly tired   Diet not the best, does not drink soda at all     Review of Systems  Constitutional: Negative for activity change, appetite change and fever.  HENT: Negative for congestion and rhinorrhea.   Eyes: Negative for discharge.  Respiratory: Negative for cough and wheezing.   Cardiovascular: Negative for chest pain.  Gastrointestinal: Negative for abdominal pain, blood in stool and vomiting.  Genitourinary: Negative for difficulty urinating and frequency.  Musculoskeletal: Negative for neck pain.  Skin: Negative for rash.  Allergic/Immunologic: Negative for environmental allergies and food allergies.  Neurological: Negative for weakness and headaches.  Psychiatric/Behavioral: Negative for agitation.  All other systems reviewed and are negative.      Objective:   Physical Exam  Constitutional: He appears well-developed and well-nourished.  HENT:  Head: Normocephalic and atraumatic.  Right Ear: External ear normal.  Left Ear: External ear normal.  Nose: Nose normal.  Mouth/Throat: Oropharynx is clear and moist.  Eyes: Pupils are equal, round, and reactive to light. EOM are normal.  Neck: Normal range of motion. Neck supple. No thyromegaly present.  Cardiovascular: Normal rate, regular rhythm and normal heart sounds.  No murmur heard. Pulmonary/Chest: Effort normal and breath sounds normal. No respiratory distress. He has no wheezes.  Abdominal: Soft. Bowel sounds are normal. He exhibits no distension and no mass. There is no tenderness.  Genitourinary: Penis normal.  Musculoskeletal: Normal range of motion. He exhibits no edema.  Lymphadenopathy:    He has no cervical adenopathy.  Neurological: He is alert. He exhibits normal muscle tone.  Skin: Skin is warm and dry. No erythema.  Psychiatric: He has a normal mood and affect. His behavior is normal. Judgment normal.  Vitals reviewed.         Assessment & Plan:  Impression 1 wellness exam. Diet  discussed. Exercise discussed. Some fatigue on further discussion wishes no further blood workup. All blood work results reviewed and scanned. #2 hypertension good control discussed maintain same meds medications refilled

## 2017-07-11 DIAGNOSIS — Z719 Counseling, unspecified: Secondary | ICD-10-CM | POA: Diagnosis not present

## 2017-07-15 LAB — PULMONARY FUNCTION TEST

## 2017-07-19 ENCOUNTER — Other Ambulatory Visit: Payer: Self-pay | Admitting: Family Medicine

## 2017-08-17 ENCOUNTER — Telehealth: Payer: Self-pay | Admitting: Family Medicine

## 2017-08-17 NOTE — Telephone Encounter (Signed)
In your folder on the wall.

## 2017-08-17 NOTE — Telephone Encounter (Signed)
Pt dropped off recent lab results and an ekg for the Dr to review. Please see in yellow folder in office.

## 2017-08-23 DIAGNOSIS — D1801 Hemangioma of skin and subcutaneous tissue: Secondary | ICD-10-CM | POA: Diagnosis not present

## 2017-08-23 DIAGNOSIS — L820 Inflamed seborrheic keratosis: Secondary | ICD-10-CM | POA: Diagnosis not present

## 2017-08-23 DIAGNOSIS — L821 Other seborrheic keratosis: Secondary | ICD-10-CM | POA: Diagnosis not present

## 2017-08-23 DIAGNOSIS — L720 Epidermal cyst: Secondary | ICD-10-CM | POA: Diagnosis not present

## 2017-09-16 DIAGNOSIS — L720 Epidermal cyst: Secondary | ICD-10-CM | POA: Diagnosis not present

## 2017-11-26 ENCOUNTER — Encounter (HOSPITAL_COMMUNITY): Payer: Self-pay | Admitting: Emergency Medicine

## 2017-11-26 ENCOUNTER — Ambulatory Visit (HOSPITAL_COMMUNITY)
Admission: EM | Admit: 2017-11-26 | Discharge: 2017-11-26 | Disposition: A | Discharge status: Home / Self Care | Payer: 59 | Attending: Family Medicine | Admitting: Family Medicine

## 2017-11-26 DIAGNOSIS — H00021 Hordeolum internum right upper eyelid: Secondary | ICD-10-CM | POA: Diagnosis not present

## 2017-11-26 MED ORDER — SULFAMETHOXAZOLE-TRIMETHOPRIM 800-160 MG PO TABS: 1.0000 | ORAL_TABLET | Freq: Two times a day (BID) | ORAL | 0 refills | Status: AC

## 2017-11-26 MED ORDER — SULFAMETHOXAZOLE-TRIMETHOPRIM 800-160 MG PO TABS: 1.0000 | ORAL_TABLET | Freq: Two times a day (BID) | ORAL | 0 refills | Status: DC

## 2017-11-26 NOTE — Discharge Instructions (Signed)
Please continue warm compresses to eyelid multiple times a day, may also try lid scrub of diluted baby shampoo to.  Please begin Bactrim twice daily for the next 5 days.

## 2017-11-26 NOTE — ED Triage Notes (Signed)
Pt c/o R upper eyelid swelling and pain, with drainage.

## 2017-11-26 NOTE — ED Provider Notes (Signed)
[place]    CSN: [id number] Arrival date & time: 11/26/17  1335     History   Chief Complaint Chief Complaint  Patient presents with  . Eye Problem    HPI Nathaniel Reed is a 42 y.o. male history of GERD and hypertension presenting today with right upper eyelid swelling.  States that this is been going on for approximately 5 days and has continued to worsen.  He has been trying warm compresses and over-the-counter stye drops without relief.  Denies any history of previous styes.  Denies wearing contacts or glasses.  His started to notice some drainage since sitting in the urgent care.  Denies fevers, denies changes in vision, denies any eye pain.  HPI  Past Medical History:  Diagnosis Date  . Acid reflux   . Hypertension     Patient Active Problem List   Diagnosis Date Noted  . Essential hypertension 08/12/2015    History reviewed. No pertinent surgical history.     Home Medications    Prior to Admission medications   Medication Sig Start Date End Date Taking? Authorizing Provider  cetirizine (ZYRTEC) 10 MG tablet Take 10 mg by mouth daily as needed for allergies. Reported on 08/08/2015    [provider]  enalapril (VASOTEC) 10 MG tablet TAKE 1 TABLET BY MOUTH  DAILY 07/20/17   Mikey Kirschner, MD  fluticasone [facility]) 50 MCG/ACT nasal spray Place into both nostrils daily.    [provider]  sulfamethoxazole-trimethoprim (BACTRIM DS,SEPTRA DS) 800-160 MG tablet Take 1 tablet by mouth 2 (two) times daily for 5 days. 11/26/17 12/01/17  Jaquelin Meaney, Elesa Hacker, PA-C    Family History Family History  Problem Relation Age of Onset  . Stroke Father   . Hypertension Father   . Heart disease Brother     Social History Social History   Tobacco Use  . Smoking status: Never Smoker  . Smokeless tobacco: Never Used  Substance Use Topics  . Alcohol use: No  . Drug use: No     Allergies   Biaxin [clarithromycin]   Review of  Systems Review of Systems  Constitutional: Negative for activity change, appetite change, fatigue and fever.  HENT: Negative for congestion and sore throat.   Eyes: Negative for pain and redness.       Upper eyelid swelling  Respiratory: Negative for cough.   Cardiovascular: Negative for chest pain.  Gastrointestinal: Negative for nausea and vomiting.  Musculoskeletal: Negative for neck pain and neck stiffness.  Neurological: Negative for dizziness, weakness, light-headedness and headaches.     Physical Exam Triage Vital Signs ED Triage Vitals [11/26/17 1435]  Enc Vitals Group     BP 128/88     Pulse Rate (!) 54     Resp 16     Temp 98.1 F (36.7 C)     Temp src      SpO2 100 %     Weight      Height      Head Circumference      Peak Flow      Pain Score      Pain Loc      Pain Edu?      Excl. in [place]?    No data found.  Updated Vital Signs BP 128/88   Pulse (!) 54   Temp 98.1 F (36.7 C)   Resp 16   SpO2 100%   Visual Acuity Right Eye Distance:   Left Eye Distance:  Bilateral Distance:    Right Eye Near:   Left Eye Near:    Bilateral Near:     Physical Exam  Constitutional: He appears well-developed and well-nourished.  HENT:  Head: Normocephalic and atraumatic.  Eyes: Pupils are equal, round, and reactive to light. Conjunctivae and EOM are normal.  See picture below, significant erythema and swelling to entire upper eyelid, white drainage seen when upper lid slightly everted, small white head noted on lash line  No conjunctival erythema  Neck: Neck supple.  Cardiovascular: Normal rate.  No murmur heard. Pulmonary/Chest: Effort normal. No respiratory distress.  Musculoskeletal: He exhibits no edema.  Neurological: He is alert.  Skin: Skin is warm and dry.  Psychiatric: He has a normal mood and affect.  Nursing note and vitals reviewed.      UC Treatments / Results  Labs (all labs ordered are listed, but only abnormal results are  displayed) Labs Reviewed - No data to display  EKG None Radiology No results found.  Procedures Procedures (including critical care time)  Medications Ordered in UC Medications - No data to display   Initial Impression / Assessment and Plan / UC Course  I have reviewed the triage vital signs and the nursing notes.  Pertinent labs & imaging results that were available during my care of the patient were reviewed by me and considered in my medical decision making (see chart for details).     She was hordeolum, given failure to improve with warm compresses will begin on oral Bactrim.  Continue warm compresses and massage as it has appeared to be draining.  Follow-up here with ophthalmology if symptoms not improving with treatment. Discussed strict return precautions. Patient verbalized understanding and is agreeable with plan.   Final Clinical Impressions(s) / UC Diagnoses   Final diagnoses:  Hordeolum internum of right upper eyelid    ED Discharge Orders        Ordered    sulfamethoxazole-trimethoprim (BACTRIM DS,SEPTRA DS) 800-160 MG tablet  2 times daily,   Status:  Discontinued     11/26/17 1523    sulfamethoxazole-trimethoprim (BACTRIM DS,SEPTRA DS) 800-160 MG tablet  2 times daily     11/26/17 1528       Controlled Substance Prescriptions [place] Controlled Substance Registry consulted? Not Applicable   Janith Lima, [place] 11/27/17 1206

## 2017-12-03 DIAGNOSIS — H0011 Chalazion right upper eyelid: Secondary | ICD-10-CM | POA: Diagnosis not present

## 2017-12-03 DIAGNOSIS — H01003 Unspecified blepharitis right eye, unspecified eyelid: Secondary | ICD-10-CM | POA: Diagnosis not present

## 2017-12-10 ENCOUNTER — Telehealth: Payer: Self-pay | Admitting: Family Medicine

## 2017-12-10 ENCOUNTER — Ambulatory Visit: Payer: 59 | Admitting: Family Medicine

## 2017-12-10 ENCOUNTER — Encounter: Payer: Self-pay | Admitting: Family Medicine

## 2017-12-10 VITALS — BP 138/90 | Temp 98.2°F | Ht 71.0 in | Wt 224.8 lb

## 2017-12-10 DIAGNOSIS — J111 Influenza due to unidentified influenza virus with other respiratory manifestations: Secondary | ICD-10-CM

## 2017-12-10 MED ORDER — OSELTAMIVIR PHOSPHATE 75 MG PO CAPS: 75.0000 mg | ORAL_CAPSULE | Freq: Two times a day (BID) | ORAL | 0 refills | Status: AC

## 2017-12-10 NOTE — Telephone Encounter (Signed)
Reviewed, no

## 2017-12-10 NOTE — Progress Notes (Signed)
   Subjective:    Patient ID: Jonel Weldon, male    DOB: [birth year], 42 y.o.   MRN: [id number]  Fever   This is a new problem. The current episode started yesterday. The maximum temperature noted was 99 to 99.9 F. The temperature was taken using an oral thermometer. Associated symptoms include congestion, coughing, headaches and a sore throat. Associated symptoms comments: Sinus pressure. He has tried acetaminophen for the symptoms. The treatment provided mild relief.   Did a sprint triathalon  yest morn got to feeling lousy  Pollen and got to feeling worse  Last night got to feeing worse as the day wet on  Felt achey  Noting pressure in the head  Throat feling sore  Not as achey today  Pos low gr fver   Has moved int oe the chest  Review of Systems  Constitutional: Positive for fever.  HENT: Positive for congestion and sore throat.   Respiratory: Positive for cough.   Neurological: Positive for headaches.       Objective:   Physical Exam  Alert vitals reviewed, moderate malaise. Hydration good. Positive nasal congestion lungs no crackles or wheezes, no tachypnea, intermittent bronchial cough during exam heart regular rate and rhythm.       Assessment & Plan:  Impression influenza discussed at length. Petra Kuba of illness and potential sequela discussed. Plan Tamiflu prescribed if indicated and timing appropriate. Symptom care discussed. Warning signs discussed. WSL

## 2017-12-10 NOTE — Telephone Encounter (Signed)
Patient notified and verbalized understanding. 

## 2017-12-10 NOTE — Telephone Encounter (Signed)
Last labs 06/2017: Lipid, Liver, Met 7, CBC and PSA

## 2017-12-10 NOTE — Telephone Encounter (Signed)
Left message to return call 

## 2017-12-10 NOTE — Telephone Encounter (Signed)
Pt has a med check in July, wonders if he will need any lab work   Please advise & call pt

## 2018-01-25 ENCOUNTER — Other Ambulatory Visit: Payer: Self-pay | Admitting: Family Medicine

## 2018-02-22 ENCOUNTER — Ambulatory Visit: Payer: 59 | Admitting: Family Medicine

## 2018-02-22 ENCOUNTER — Encounter: Payer: Self-pay | Admitting: Family Medicine

## 2018-02-22 VITALS — BP 118/80 | Ht 71.0 in | Wt 223.0 lb

## 2018-02-22 DIAGNOSIS — I10 Essential (primary) hypertension: Secondary | ICD-10-CM | POA: Diagnosis not present

## 2018-02-22 MED ORDER — ENALAPRIL MALEATE 10 MG PO TABS: 10.0000 mg | ORAL_TABLET | Freq: Every day | ORAL | 1 refills | Status: DC

## 2018-02-22 NOTE — Progress Notes (Signed)
   Subjective:    Patient ID: Nathaniel Reed, male    DOB: 1975-11-15, 42 y.o.   MRN: [id number]  HPI Patient is here today to follow up on his htn. He takes enalapril 10 mg daily. He eats healthy and exercises.   Blood pressure medicine and blood pressure levels reviewed today with patient. Compliant with blood pressure medicine. States does not miss a dose. No obvious side effects. Blood pressure generally good when checked elsewhere. Watching salt intake.  3exercise going well, did triathalon in April, riding a lot, swimming   BP cked at home and work , usually 130 over [place], overall good control , not elevated    Review of Systems No headache, no major weight loss or weight gain, no chest pain no back pain abdominal pain no change in bowel habits complete ROS otherwise negative     Objective:   Physical Exam  Alert vitals stable, NAD. Blood pressure good on repeat. HEENT normal. Lungs clear. Heart regular rate and rhythm.       Assessment & Plan:  Hypertension.  Good control.  Discussed.  Maintain same dose of medication rationale discussed.  Slight lightheadedness in the middle the night otherwise tolerates great during the day.  Follow-up in 6 months.

## 2018-02-28 ENCOUNTER — Other Ambulatory Visit: Payer: Self-pay | Admitting: *Deleted

## 2018-02-28 ENCOUNTER — Telehealth: Payer: Self-pay | Admitting: Family Medicine

## 2018-02-28 MED ORDER — ENALAPRIL MALEATE 10 MG PO TABS: 10.0000 mg | ORAL_TABLET | Freq: Every day | ORAL | 1 refills | Status: DC

## 2018-02-28 NOTE — Telephone Encounter (Signed)
[facility]ed [facility] and [facility] and [facility]. Pt notified.

## 2018-02-28 NOTE — Telephone Encounter (Signed)
Patient was here last week and said his enalapril was suppose to be sent to [facility] and not [facility]. Can we resend?

## 2018-07-05 ENCOUNTER — Telehealth: Payer: Self-pay | Admitting: Family Medicine

## 2018-07-05 NOTE — Telephone Encounter (Signed)
Pt dropped off EKG & lab results that he had done at work.  States they were concerned with the EKG  Please review results & advise.  Pt does not have appt scheduled here but is due January for 6 month follow up - may need to be seen sooner  Pt really anxious, please call soon   In blue folder in yellow box in Dr. Jeannine Kitten office

## 2018-07-05 NOTE — Telephone Encounter (Signed)
Do you want to see pt Friday or week after next when you return.

## 2018-07-05 NOTE — Telephone Encounter (Signed)
Appt scheduled 9:30 07/08/2018. Pt aware.

## 2018-07-05 NOTE — Telephone Encounter (Signed)
Needs o v wee dont tjhis type of concern over the phone

## 2018-07-05 NOTE — Telephone Encounter (Signed)
Discussed with pt pt can come in on Friday. Please call pt today and schedule for Friday with dr Richardson Landry since not able to schedule while he was on the phone.

## 2018-07-05 NOTE — Telephone Encounter (Signed)
fri ok

## 2018-07-08 ENCOUNTER — Encounter: Payer: Self-pay | Admitting: Family Medicine

## 2018-07-08 ENCOUNTER — Ambulatory Visit: Payer: 59 | Admitting: Family Medicine

## 2018-07-08 VITALS — BP 124/84 | Ht 71.0 in | Wt 223.0 lb

## 2018-07-08 DIAGNOSIS — R9431 Abnormal electrocardiogram [ECG] [EKG]: Secondary | ICD-10-CM

## 2018-07-08 DIAGNOSIS — Z1329 Encounter for screening for other suspected endocrine disorder: Secondary | ICD-10-CM

## 2018-07-08 DIAGNOSIS — I10 Essential (primary) hypertension: Secondary | ICD-10-CM

## 2018-07-08 DIAGNOSIS — R5383 Other fatigue: Secondary | ICD-10-CM | POA: Diagnosis not present

## 2018-07-08 MED ORDER — ENALAPRIL MALEATE 10 MG PO TABS: 10.0000 mg | ORAL_TABLET | Freq: Every day | ORAL | 1 refills | Status: DC

## 2018-07-08 NOTE — Progress Notes (Signed)
   Subjective:    Patient ID: Nathaniel Reed, male    DOB: 1976/04/29, 42 y.o.   MRN: [id number] Arrives with numerous concerns HPI Pt here today for discussion about labs and EKG. Pt had labs at work for annual physical exam. Pt states he would also like to do BP follow up  . Pt states he is suppose to come back next month for BP follow up. Only checks BP every couple months or so.BP generally good, rare lightheadness with sudden stnding .   PT has concerns about EKG.  MD (occupational) told pt it may represent old MI< this alrmed family, ekg reveals poor r wave progresion thru v 2, a bit of change compared to two yrs ago  Pt had major card w u due to suymtoms plus anxiety, with neg stress echo   pts spouse reports sig fatigue also    Rare lightneadedness           Review of Systems No headache, no major weight loss or weight gain, no chest pain no back pain abdominal pain no change in bowel habits complete ROS otherwise negative     Objective:   Physical Exam  Alert and oriented, vitals reviewed and stable, NAD ENT-TM's and ext canals WNL bilat via otoscopic exam Soft palate, tonsils and post pharynx WNL via oropharyngeal exam Neck-symmetric, no masses; thyroid nonpalpable and nontender Pulmonary-no tachypnea or accessory muscle use; Clear without wheezes via auscultation Card--no abnrml murmurs, rhythm reg and rate WNL Carotid pulses symmetric, without bruits       Assessment & Plan:  Abnormal EKG.  Likely poor R wave progression that does not represent underlying heart disease.  Discussed.  Family would still prefer to get a cardiology referral.  We will work on this  2.  Fatigue.  Nonspecific.  Blood work reviewed.  Last thyroid study  3.  Hypertension.  Good control discussed.  To maintain same meds  Follow-up in 6 months  Greater than 50% of this 25 minute face to face visit was spent in counseling and discussion and coordination of care  regarding the above diagnosis/diagnosies

## 2018-07-09 LAB — TSH: TSH: 1.81 u[IU]/mL (ref 0.450–4.500)

## 2018-07-13 ENCOUNTER — Encounter: Payer: Self-pay | Admitting: Family Medicine

## 2018-12-27 ENCOUNTER — Other Ambulatory Visit: Payer: Self-pay | Admitting: Family Medicine

## 2018-12-28 NOTE — Telephone Encounter (Signed)
LVM to call and schedule 6 month followup.  °

## 2018-12-28 NOTE — Telephone Encounter (Signed)
sched six mo visit, then may ref times one

## 2018-12-30 NOTE — Telephone Encounter (Signed)
See my note ealrier in this thread

## 2019-01-04 ENCOUNTER — Encounter: Payer: Self-pay | Admitting: Family Medicine

## 2019-01-04 ENCOUNTER — Ambulatory Visit: Payer: 59 | Admitting: Family Medicine

## 2019-01-04 ENCOUNTER — Other Ambulatory Visit: Payer: Self-pay

## 2019-01-04 VITALS — BP 118/76 | Temp 97.4°F | Wt 221.6 lb

## 2019-01-04 DIAGNOSIS — R400 Somnolence: Secondary | ICD-10-CM | POA: Diagnosis not present

## 2019-01-04 DIAGNOSIS — I10 Essential (primary) hypertension: Secondary | ICD-10-CM | POA: Diagnosis not present

## 2019-01-04 MED ORDER — ENALAPRIL MALEATE 10 MG PO TABS: 10.0000 mg | ORAL_TABLET | Freq: Every day | ORAL | 5 refills | Status: DC

## 2019-01-04 NOTE — Progress Notes (Signed)
   Subjective:    Patient ID: Nathaniel Reed, male    DOB: 17-Nov-1975, 43 y.o.   MRN: [id number]  Hypertension  This is a chronic problem. The current episode started more than 1 year ago. Risk factors for coronary artery disease include male gender. Treatments tried: vasotec. There are no compliance problems.    Patient states he is still having difficulties sleeping at night and wonders if he has sleep apnea   BPs still a little hi at times, now recently nunbers are down   117 62   126 74  140 93  That day had eaten sodium,,123 over 76  The 126 over 74  Exercise nt so good   Blood pressure medicine and blood pressure levels reviewed today with patient. Compliant with blood pressure medicine. States does not miss a dose. No obvious side effects. Blood pressure generally good when checked elsewhere. Watching salt intake.   Has fell off on bp  mangement  Pt feeling tired during the day   Dim energy at times  Not as good as should be   Has snoring like breathing pattern tha tconcerns wife    sawent in the past  [place] sleep study form filled out.  Patient is strongly positive for potential sleep apnea.  Discussed.  Notes substantial daytime fatigue.   Review of Systems No headache, no major weight loss or weight gain, no chest pain no back pain abdominal pain no change in bowel habits complete ROS otherwise negative     Objective:   Physical Exam Alert and oriented, vitals reviewed and stable, NAD ENT-TM's and ext canals WNL bilat via otoscopic exam Soft palate, tonsils and post pharynx WNL via oropharyngeal exam Neck-symmetric, no masses; thyroid nonpalpable and nontender Pulmonary-no tachypnea or accessory muscle use; Clear without wheezes via auscultation Card--no abnrml murmurs, rhythm reg and rate WNL Carotid pulses symmetric, without bruits        Assessment & Plan:  Impression 1 hypertension.  Discussed being difficult intensity  2.   Coronavirus concerns.  Discussed.  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  Potential sleep apnea.  Substantial daytime fatigue.  Burlin score strongly positive.  Substantial snoring.  Substantial breath stoppages through the night.  Progressive worsening positive family history.  Discussed we will press on with sleep study

## 2019-01-09 ENCOUNTER — Encounter: Payer: Self-pay | Admitting: Family Medicine

## 2019-02-20 ENCOUNTER — Other Ambulatory Visit: Payer: Self-pay

## 2019-02-20 ENCOUNTER — Emergency Department (HOSPITAL_COMMUNITY)
Admission: EM | Admit: 2019-02-20 | Discharge: 2019-02-21 | Disposition: A | Discharge status: Home / Self Care | Payer: 59 | Attending: Emergency Medicine | Admitting: Emergency Medicine

## 2019-02-20 ENCOUNTER — Encounter (HOSPITAL_COMMUNITY): Payer: Self-pay | Admitting: *Deleted

## 2019-02-20 DIAGNOSIS — I10 Essential (primary) hypertension: Secondary | ICD-10-CM | POA: Insufficient documentation

## 2019-02-20 DIAGNOSIS — Y929 Unspecified place or not applicable: Secondary | ICD-10-CM | POA: Diagnosis not present

## 2019-02-20 DIAGNOSIS — W504XXA Accidental scratch by another person, initial encounter: Secondary | ICD-10-CM | POA: Diagnosis not present

## 2019-02-20 DIAGNOSIS — S0501XA Injury of conjunctiva and corneal abrasion without foreign body, right eye, initial encounter: Secondary | ICD-10-CM | POA: Diagnosis not present

## 2019-02-20 DIAGNOSIS — Z79899 Other long term (current) drug therapy: Secondary | ICD-10-CM | POA: Diagnosis not present

## 2019-02-20 DIAGNOSIS — S0591XA Unspecified injury of right eye and orbit, initial encounter: Secondary | ICD-10-CM | POA: Diagnosis present

## 2019-02-20 DIAGNOSIS — Y9389 Activity, other specified: Secondary | ICD-10-CM | POA: Insufficient documentation

## 2019-02-20 DIAGNOSIS — Y999 Unspecified external cause status: Secondary | ICD-10-CM | POA: Insufficient documentation

## 2019-02-20 MED ORDER — FLUORESCEIN SODIUM 1 MG OP STRP
1.0000 | ORAL_STRIP | Freq: Once | OPHTHALMIC | Status: AC
  Administered 2019-02-21: 1 via OPHTHALMIC
  Filled 2019-02-20: qty 1

## 2019-02-20 MED ORDER — TETRACAINE HCL 0.5 % OP SOLN
1.0000 [drp] | Freq: Once | OPHTHALMIC | Status: AC
  Administered 2019-02-21: 1 [drp] via OPHTHALMIC
  Filled 2019-02-20: qty 4

## 2019-02-20 NOTE — ED Triage Notes (Signed)
Pt reports accidentally scratching right eye this evening and now has severe pain, redness and blurred vision.

## 2019-02-21 MED ORDER — POLYMYXIN B-TRIMETHOPRIM 10000-0.1 UNIT/ML-% OP SOLN
1.0000 [drp] | OPHTHALMIC | Status: DC
  Administered 2019-02-21: 1 [drp] via OPHTHALMIC
  Filled 2019-02-21: qty 10

## 2019-02-21 MED ORDER — KETOROLAC TROMETHAMINE 0.5 % OP SOLN
1.0000 [drp] | Freq: Four times a day (QID) | OPHTHALMIC | Status: DC
  Administered 2019-02-21: 1 [drp] via OPHTHALMIC
  Filled 2019-02-21: qty 5

## 2019-02-21 NOTE — ED Provider Notes (Signed)
[facility] EMERGENCY DEPARTMENT Provider Note   CSN: [id number] Arrival date & time: 02/20/19  2325     History   Chief Complaint Chief Complaint  Patient presents with  . Eye Injury    HPI Nathaniel Reed is a 43 y.o. male.     The history is provided by the patient and medical records.  Eye Injury     43 year old male with history of hypertension and acid reflux, presenting to the ED with right eye pain.  He states his wife accidentally crashed him in the eye with her fingernail.  States since then he has had sensation that there is something stuck in his eye, blurred vision, and the eye is watering.  He does not wear glasses or contact lenses but has had prior corrective surgery and was concerned for this.  Past Medical History:  Diagnosis Date  . Acid reflux   . Hypertension     Patient Active Problem List   Diagnosis Date Noted  . Essential hypertension 08/12/2015    History reviewed. No pertinent surgical history.      Home Medications    Prior to Admission medications   Medication Sig Start Date End Date Taking? Authorizing Provider  cetirizine (ZYRTEC) 10 MG tablet Take 10 mg by mouth daily as needed for allergies. Reported on 08/08/2015    [provider]  enalapril (VASOTEC) 10 MG tablet Take 1 tablet (10 mg total) by mouth daily. 01/04/19   Mikey Kirschner, MD  fluticasone (FLONASE) 50 MCG/ACT nasal spray Place into both nostrils daily.    [provider]    Family History Family History  Problem Relation Age of Onset  . Stroke Father   . Hypertension Father   . Heart disease Brother     Social History Social History   Tobacco Use  . Smoking status: Never Smoker  . Smokeless tobacco: Never Used  Substance Use Topics  . Alcohol use: No  . Drug use: No     Allergies   Biaxin [clarithromycin]   Review of Systems Review of Systems  Eyes: Positive for pain.  All other systems reviewed and are  negative.    Physical Exam Updated Vital Signs BP (!) 134/104 (BP Location: Right Arm)   Pulse (!) 53   Temp 98.5 F (36.9 C) (Oral)   Resp 18   SpO2 100%   Physical Exam Vitals signs and nursing note reviewed.  Constitutional:      Appearance: He is well-developed.  HENT:     Head: Normocephalic and atraumatic.  Eyes:     Conjunctiva/sclera: Conjunctivae normal.     Pupils: Pupils are equal, round, and reactive to light.     Comments: Right eye without any lid edema or erythema; obvious photophobia, conjunctive is injected and eye is tearing, left eye normal Fluorescein stain-- with corneal abrasion, no ulcer or uptake noted  Neck:     Musculoskeletal: Normal range of motion.  Cardiovascular:     Rate and Rhythm: Normal rate and regular rhythm.     Heart sounds: Normal heart sounds.  Pulmonary:     Effort: Pulmonary effort is normal.     Breath sounds: Normal breath sounds.  Abdominal:     General: Bowel sounds are normal.     Palpations: Abdomen is soft.  Musculoskeletal: Normal range of motion.  Skin:    General: Skin is warm and dry.  Neurological:     Mental Status: He is alert and oriented  to person, place, and time.      ED Treatments / Results  Labs (all labs ordered are listed, but only abnormal results are displayed) Labs Reviewed - No data to display  EKG None  Radiology No results found.  Procedures Procedures (including critical care time)  Medications Ordered in ED Medications  ketorolac (ACULAR) 0.5 % ophthalmic solution 1 drop (1 drop Right Eye Given 02/21/19 0045)  trimethoprim-polymyxin b (POLYTRIM) ophthalmic solution 1 drop (1 drop Right Eye Given 02/21/19 0045)  tetracaine (PONTOCAINE) 0.5 % ophthalmic solution 1 drop (1 drop Both Eyes Given by Other 02/21/19 0026)  fluorescein ophthalmic strip 1 strip (1 strip Both Eyes Given by Other 02/21/19 0026)     Initial Impression / Assessment and Plan / ED Course  I have reviewed the  triage vital signs and the nursing notes.  Pertinent labs & imaging results that were available during my care of the patient were reviewed by me and considered in my medical decision making (see chart for details).  43 year old male here with right eye pain.  He states his wife accidentally scratched him in the eye with her fingernail.  He has blurred vision, eye tearing, and sensation of foreign body present in the eye.  His conjunctive is injected but no lid edema or erythema.  Eyes continuously tearing on my assessment.  Fluorescein stain was performed and patient has obvious right corneal abrasion.  No uptake.  Will start on polytrim as well as Acular drops.  He will need close follow-up with his eye doctor in the next few days or sooner if worsening.  He does not wear any corrective lenses, have encouraged him to wear protective eyewear when in sunlight.  Return here for any new/acute changes.  Final Clinical Impressions(s) / ED Diagnoses   Final diagnoses:  Abrasion of right cornea, initial encounter    ED Discharge Orders    None       Larene Pickett, PA-C 02/21/19 0050    Mesner, Corene Cornea, MD 02/21/19 [phone]

## 2019-02-21 NOTE — Discharge Instructions (Addendum)
Continue using both drops -- acular every 6 hours, polytrim every 4.  Make sure to wear sunglasses when outside. Recommend close follow-up with your eye doctor within the next few days for re-check or sooner if worsening. Return here for any new/acute changes.

## 2019-03-02 ENCOUNTER — Telehealth: Payer: Self-pay | Admitting: Family Medicine

## 2019-03-02 DIAGNOSIS — R400 Somnolence: Secondary | ICD-10-CM

## 2019-03-02 NOTE — Telephone Encounter (Signed)
[facility] to inform us that pt's insurance denied request for split night sleep study  Dr. Richardson Landry can do a peer-to-peer to try to get it approved (or maybe change test ordered)  [phone], case# [id number]  Please call the [facility] [place] to update them on approval or change of test ordered

## 2019-03-02 NOTE — Telephone Encounter (Signed)
Please advise. Thank you

## 2019-03-12 NOTE — Telephone Encounter (Signed)
I will not do peer to peer, they can change to  Whatever his insur covers (likely home study to start, then if that's positive may need further eval or mot depending on results)

## 2019-03-13 NOTE — Telephone Encounter (Signed)
Please place [facility] to [facility], they will handle all prior auth's & contact pt

## 2019-03-13 NOTE — Telephone Encounter (Signed)
Home sleep study ordered in EPIC

## 2019-03-22 ENCOUNTER — Ambulatory Visit: Payer: 59 | Attending: Family Medicine | Admitting: Neurology

## 2019-03-22 ENCOUNTER — Other Ambulatory Visit: Payer: Self-pay

## 2019-03-22 DIAGNOSIS — R0683 Snoring: Secondary | ICD-10-CM | POA: Insufficient documentation

## 2019-03-22 DIAGNOSIS — Z79899 Other long term (current) drug therapy: Secondary | ICD-10-CM | POA: Insufficient documentation

## 2019-03-22 DIAGNOSIS — R400 Somnolence: Secondary | ICD-10-CM | POA: Insufficient documentation

## 2019-03-22 DIAGNOSIS — Z7951 Long term (current) use of inhaled steroids: Secondary | ICD-10-CM | POA: Insufficient documentation

## 2019-03-30 NOTE — Procedures (Signed)
   [place] A. Merlene Laughter, MD     [email or web address]         HOME SLEEP TEST  LOCATION: [facility]   Patient Name: Nathaniel Reed, Nathaniel Reed Date: 03/22/2019 Gender: Male D.O.B: [birth year] Age (years): 38 Referring Provider: Margaretmary Eddy Height (inches): 71 Interpreting Physician: Phillips Odor MD, ABSM Weight (lbs): 221 RPSGT: Peak, Robert BMI: 31 MRN: [id number] Neck Size: CLINICAL INFORMATION Sleep Study Type: HST     Indication for sleep study: Daytime Fatigue     Epworth Sleepiness Score: NA  SLEEP STUDY TECHNIQUE A multi-channel overnight portable sleep study was performed. The channels recorded were: nasal airflow, thoracic respiratory movement, and oxygen saturation with a pulse oximetry. Snoring was also monitored.  MEDICATIONS Patient self administered medications include: N/A.  Current Outpatient Medications:  .  cetirizine (ZYRTEC) 10 MG tablet, Take 10 mg by mouth daily as needed for allergies. Reported on 08/08/2015, Disp: , Rfl:  .  enalapril (VASOTEC) 10 MG tablet, Take 1 tablet (10 mg total) by mouth daily., Disp: 30 tablet, Rfl: 5 .  fluticasone (FLONASE) 50 MCG/ACT nasal spray, Place into both nostrils daily., Disp: , Rfl:    SLEEP ARCHITECTURE Patient was studied for 537.5 minutes. The sleep efficiency was 89.4 % and the patient was supine for 13%. The arousal index was 0.0 per hour.  RESPIRATORY PARAMETERS The overall AHI was 1.5 per hour, with a central apnea index of 0.7 per hour.  The oxygen nadir was 84% during sleep.     CARDIAC DATA Mean heart rate during sleep was 49.0 bpm.  IMPRESSIONS No significant obstructive sleep apnea occurred during this study (AHI = 1.5/h).   Delano Metz, MD Diplomate, American Board of Sleep Medicine.  ELECTRONICALLY SIGNED ON:  03/30/2019, 10:21 AM [place] PH: [phone]   FX: [phone] [place]

## 2019-04-12 ENCOUNTER — Telehealth: Payer: Self-pay | Admitting: Family Medicine

## 2019-04-12 NOTE — Telephone Encounter (Signed)
Pt returned call and verbalized understanding  

## 2019-04-12 NOTE — Telephone Encounter (Signed)
Left message to return call 

## 2019-04-12 NOTE — Telephone Encounter (Signed)
Please advise. Thank you

## 2019-04-12 NOTE — Telephone Encounter (Signed)
Patient would like results of sleep study done on 8/12

## 2019-04-12 NOTE — Telephone Encounter (Signed)
Let pt know specialist hasnt signed off yet and given his interpretation.still waiting on that. I looked at rough data myself. On average he had only one or two spells per hr of pausing of breating and a one or so spells per hour of slowing of breathing. This is considered wnl and no one out there would recommend intervention for this very minimal sleep apnea activity. Generally experts do not rec treatment until 12 to 16 events per hour

## 2019-05-30 ENCOUNTER — Other Ambulatory Visit: Payer: Self-pay | Admitting: Family Medicine

## 2019-08-19 ENCOUNTER — Other Ambulatory Visit: Payer: Self-pay | Admitting: Family Medicine

## 2019-08-21 NOTE — Telephone Encounter (Signed)
Patient made appointment for 6 month follow up 1/28

## 2019-08-21 NOTE — Telephone Encounter (Signed)
Please contact patient to schedule appointment; then may route back to nurses. Thank you

## 2019-09-06 ENCOUNTER — Encounter: Payer: Self-pay | Admitting: Family Medicine

## 2019-09-06 ENCOUNTER — Ambulatory Visit (INDEPENDENT_AMBULATORY_CARE_PROVIDER_SITE_OTHER): Payer: 59 | Admitting: Family Medicine

## 2019-09-06 ENCOUNTER — Other Ambulatory Visit: Payer: Self-pay

## 2019-09-06 VITALS — BP 125/73 | Ht 71.0 in

## 2019-09-06 DIAGNOSIS — I10 Essential (primary) hypertension: Secondary | ICD-10-CM | POA: Diagnosis not present

## 2019-09-06 MED ORDER — ENALAPRIL MALEATE 10 MG PO TABS: 10.0000 mg | ORAL_TABLET | Freq: Every day | ORAL | 1 refills | Status: DC

## 2019-09-06 NOTE — Progress Notes (Signed)
   Subjective:  Audio only  Patient ID: Ruhaan Taubman, male    DOB: 06-Dec-1975, 44 y.o.   MRN: [id number] Pt states last time he checked bp it was 125/73. Highest was 140/93.   Hypertension This is a chronic problem. There are no compliance problems (takes med every day, walks for exercise and eats healthy).    Virtual Visit via Telephone Note  I connected with Orley Sowerby on 09/06/19 at  9:30 AM EST by telephone and verified that I am speaking with the correct person using two identifiers.  Location: Patient: home Provider: office   I discussed the limitations, risks, security and privacy concerns of performing an evaluation and management service by telephone and the availability of in person appointments. I also discussed with the patient that there may be a patient responsible charge related to this service. The patient expressed understanding and agreed to proceed.   History of Present Illness:    Observations/Objective:   Assessment and Plan:   Follow Up Instructions:    I discussed the assessment and treatment plan with the patient. The patient was provided an opportunity to ask questions and all were answered. The patient agreed with the plan and demonstrated an understanding of the instructions.   The patient was advised to call back or seek an in-person evaluation if the symptoms worsen or if the condition fails to improve as anticipated.  I provided 5minutes of non-face-to-face time during this encounter.  Blood pressure medicine and blood pressure levels reviewed today with patient. Compliant with blood pressure medicine. States does not miss a dose. No obvious side effects. Blood pressure generally good when checked elsewhere. Watching salt intake.     Review of Systems No headache, no major weight loss or weight gain, no chest pain no back pain abdominal pain no change in bowel habits complete ROS otherwise negative     Objective:   Physical  Exam  Virtual      Assessment & Plan:  Impression hypertension.  Para good control.  Diet discussed exercise discussed medications refilled.  Patient to get back blood work to [place] that he had drawn earlier this winter.  Of note had a sleep study which was negative  Follow-up in 6 months diet exercise discussed medications refilled

## 2019-09-11 ENCOUNTER — Encounter: Payer: Self-pay | Admitting: Family Medicine

## 2020-01-16 ENCOUNTER — Other Ambulatory Visit: Payer: Self-pay | Admitting: Family Medicine

## 2020-01-17 NOTE — Telephone Encounter (Signed)
Cbc, cmp, lipid panel. Thx. Dr. Lovena Le

## 2020-01-17 NOTE — Telephone Encounter (Signed)
Needs appt in 11-12 wks for f/u and labs.   Refill sent.   Dr. Lovena Le

## 2020-01-17 NOTE — Telephone Encounter (Signed)
Last labs 06/28/18 was lipid and cmp. Is this what you want to order?

## 2020-01-17 NOTE — Telephone Encounter (Signed)
Seen 09/06/19 for htn

## 2020-01-19 ENCOUNTER — Other Ambulatory Visit: Payer: Self-pay | Admitting: *Deleted

## 2020-01-19 DIAGNOSIS — I10 Essential (primary) hypertension: Secondary | ICD-10-CM

## 2020-01-19 DIAGNOSIS — Z79899 Other long term (current) drug therapy: Secondary | ICD-10-CM

## 2020-01-19 DIAGNOSIS — Z13220 Encounter for screening for lipoid disorders: Secondary | ICD-10-CM

## 2020-01-19 NOTE — Telephone Encounter (Signed)
Orders put in and mailed to pt with a note to do in 11 -12 weeks with a follow up visit

## 2020-02-05 ENCOUNTER — Telehealth: Payer: Self-pay | Admitting: *Deleted

## 2020-02-05 ENCOUNTER — Other Ambulatory Visit: Payer: Self-pay

## 2020-02-05 ENCOUNTER — Emergency Department (HOSPITAL_COMMUNITY)
Admission: EM | Admit: 2020-02-05 | Discharge: 2020-02-05 | Disposition: A | Discharge status: Home / Self Care | Payer: 59 | Attending: Emergency Medicine | Admitting: Emergency Medicine

## 2020-02-05 DIAGNOSIS — U071 COVID-19: Secondary | ICD-10-CM | POA: Diagnosis not present

## 2020-02-05 DIAGNOSIS — Z79899 Other long term (current) drug therapy: Secondary | ICD-10-CM | POA: Diagnosis not present

## 2020-02-05 DIAGNOSIS — R110 Nausea: Secondary | ICD-10-CM | POA: Diagnosis present

## 2020-02-05 DIAGNOSIS — I10 Essential (primary) hypertension: Secondary | ICD-10-CM | POA: Diagnosis not present

## 2020-02-05 LAB — CBC
HCT: 42.7 % (ref 39.0–52.0)
Hemoglobin: 15.2 g/dL (ref 13.0–17.0)
MCH: 32.4 pg (ref 26.0–34.0)
MCHC: 35.6 g/dL (ref 30.0–36.0)
MCV: 91 fL (ref 80.0–100.0)
Platelets: 124 10*3/uL — ABNORMAL LOW (ref 150–400)
RBC: 4.69 MIL/uL (ref 4.22–5.81)
RDW: 12.2 % (ref 11.5–15.5)
WBC: 5.3 10*3/uL (ref 4.0–10.5)
nRBC: 0 % (ref 0.0–0.2)

## 2020-02-05 LAB — BASIC METABOLIC PANEL
Anion gap: 7 (ref 5–15)
BUN: 9 mg/dL (ref 6–20)
CO2: 23 mmol/L (ref 22–32)
Calcium: 7.1 mg/dL — ABNORMAL LOW (ref 8.9–10.3)
Chloride: 108 mmol/L (ref 98–111)
Creatinine, Ser: 1 mg/dL (ref 0.61–1.24)
GFR calc Af Amer: >60 mL/min (ref >60)
GFR calc non Af Amer: >60 mL/min (ref >60)
Glucose, Bld: 91 mg/dL (ref 70–99)
Potassium: 3.7 mmol/L (ref 3.5–5.1)
Sodium: 138 mmol/L (ref 135–145)

## 2020-02-05 MED ORDER — FAMOTIDINE IN NACL 20-0.9 MG/50ML-% IV SOLN: 20.0000 mg | Freq: Once | INTRAVENOUS | Status: DC | PRN

## 2020-02-05 MED ORDER — SODIUM CHLORIDE 0.9 % IV BOLUS (SEPSIS)
1000.0000 mL | Freq: Once | INTRAVENOUS | Status: AC
  Administered 2020-02-05: 1000 mL via INTRAVENOUS

## 2020-02-05 MED ORDER — DIPHENHYDRAMINE HCL 50 MG/ML IJ SOLN: 50.0000 mg | Freq: Once | INTRAMUSCULAR | Status: DC | PRN

## 2020-02-05 MED ORDER — ALBUTEROL SULFATE HFA 108 (90 BASE) MCG/ACT IN AERS: 2.0000 | INHALATION_SPRAY | Freq: Once | RESPIRATORY_TRACT | Status: DC | PRN

## 2020-02-05 MED ORDER — ONDANSETRON HCL 4 MG/2ML IJ SOLN
4.0000 mg | Freq: Once | INTRAMUSCULAR | Status: AC
  Administered 2020-02-05: 4 mg via INTRAVENOUS
  Filled 2020-02-05: qty 2

## 2020-02-05 MED ORDER — ONDANSETRON 8 MG PO TBDP: 8.0000 mg | ORAL_TABLET | Freq: Three times a day (TID) | ORAL | 0 refills | Status: AC | PRN

## 2020-02-05 MED ORDER — SODIUM CHLORIDE 0.9 % IV SOLN: INTRAVENOUS | Status: DC | PRN

## 2020-02-05 MED ORDER — METHYLPREDNISOLONE SODIUM SUCC 125 MG IJ SOLR: 125.0000 mg | Freq: Once | INTRAMUSCULAR | Status: DC | PRN

## 2020-02-05 MED ORDER — SODIUM CHLORIDE 0.9 % IV SOLN: Freq: Once | INTRAVENOUS | Status: DC

## 2020-02-05 MED ORDER — EPINEPHRINE 0.3 MG/0.3ML IJ SOAJ: 0.3000 mg | Freq: Once | INTRAMUSCULAR | Status: DC | PRN

## 2020-02-05 NOTE — ED Triage Notes (Signed)
Patient reports to the ED for COVID + with nausea and dizziness. Patient is 7 days into his diagnosis of COVID. Patient reports lack of appetite

## 2020-02-05 NOTE — ED Provider Notes (Signed)
[facility] DEPT Provider Note   CSN: [id number] Arrival date & time: 02/05/20  1016     History Chief Complaint  Patient presents with  . Nausea  . Dizziness  . Covid Postivie    Nathaniel Reed is a 44 y.o. male.  HPI   Patient presented to the ED for evaluation of nausea and dizziness.  Patient states he was exposed to someone with Covid at work a little over a week ago.  He started having symptoms over the weekend and tested positive last Monday.  Patient has been managing at home.  He has had persistent fevers will up to 101,102.  He has been having nausea and decreased appetite.  Patient has not been able to eat or drink well.  This morning he was concerned however because he felt very lightheaded as if he was going to pass out.  He denies any shortness of breath.  No headache.  No palpitations.  Past Medical History:  Diagnosis Date  . Acid reflux   . Hypertension     Patient Active Problem List   Diagnosis Date Noted  . Essential hypertension 08/12/2015    No past surgical history on file.     Family History  Problem Relation Age of Onset  . Stroke Father   . Hypertension Father   . Heart disease Brother     Social History   Tobacco Use  . Smoking status: Never Smoker  . Smokeless tobacco: Never Used  Substance Use Topics  . Alcohol use: No  . Drug use: No    Home Medications Prior to Admission medications   Medication Sig Start Date End Date Taking? Authorizing Provider  enalapril (VASOTEC) 10 MG tablet TAKE 1 TABLET BY MOUTH  DAILY 01/17/20   Lovena Le, Malena M, DO  fluticasone (FLONASE) 50 MCG/ACT nasal spray Place into both nostrils daily.    [provider]  ondansetron (ZOFRAN ODT) 8 MG disintegrating tablet Take 1 tablet (8 mg total) by mouth every 8 (eight) hours as needed for nausea or vomiting. 02/05/20   Dorie Rank, MD    Allergies    Biaxin [clarithromycin]  Review of Systems   Review of Systems    All other systems reviewed and are negative.   Physical Exam Updated Vital Signs BP (!) 141/79   Pulse 81   Temp 98.9 F (37.2 C) (Oral)   Resp 14   Ht 1.803 m (5\' 11" )   Wt 99.8 kg   SpO2 98%   BMI 30.68 kg/m   Physical Exam Vitals and nursing note reviewed.  Constitutional:      General: He is not in acute distress.    Appearance: He is well-developed.  HENT:     Head: Normocephalic and atraumatic.     Right Ear: External ear normal.     Left Ear: External ear normal.  Eyes:     General: No scleral icterus.       Right eye: No discharge.        Left eye: No discharge.     Conjunctiva/sclera: Conjunctivae normal.  Neck:     Trachea: No tracheal deviation.  Cardiovascular:     Rate and Rhythm: Normal rate and regular rhythm.  Pulmonary:     Effort: Pulmonary effort is normal. No respiratory distress.     Breath sounds: Normal breath sounds. No stridor. No wheezing or rales.  Abdominal:     General: Bowel sounds are normal. There is no distension.  Palpations: Abdomen is soft.     Tenderness: There is no abdominal tenderness. There is no guarding or rebound.  Musculoskeletal:        General: No tenderness.     Cervical back: Neck supple.  Skin:    General: Skin is warm and dry.     Findings: No rash.  Neurological:     Mental Status: He is alert.     Cranial Nerves: No cranial nerve deficit (no facial droop, extraocular movements intact, no slurred speech).     Sensory: No sensory deficit.     Motor: No abnormal muscle tone or seizure activity.     Coordination: Coordination normal.     ED Results / Procedures / Treatments   Labs (all labs ordered are listed, but only abnormal results are displayed) Labs Reviewed  CBC - Abnormal; Notable for the following components:      Result Value   Platelets 124 (*)    All other components within normal limits  BASIC METABOLIC PANEL - Abnormal; Notable for the following components:   Calcium 7.1 (*)    All  other components within normal limits    EKG None  Radiology No results found.  Procedures Procedures (including critical care time)  Medications Ordered in ED Medications  0.9 %  sodium chloride infusion (has no administration in time range)  diphenhydrAMINE (BENADRYL) injection 50 mg (has no administration in time range)  famotidine (PEPCID) IVPB 20 mg premix (has no administration in time range)  methylPREDNISolone sodium succinate (SOLU-MEDROL) 125 mg/2 mL injection 125 mg (has no administration in time range)  albuterol (VENTOLIN HFA) 108 (90 Base) MCG/ACT inhaler 2 puff (has no administration in time range)  EPINEPHrine (EPI-PEN) injection 0.3 mg (has no administration in time range)  sodium chloride 0.9 % bolus 1,000 mL (0 mLs Intravenous Stopped 02/05/20 1239)  ondansetron (ZOFRAN) injection 4 mg (4 mg Intravenous Given 02/05/20 1110)    ED Course  I have reviewed the triage vital signs and the nursing notes.  Pertinent labs & imaging results that were available during my care of the patient were reviewed by me and considered in my medical decision making (see chart for details).  Clinical Course as of Feb 04 1410  Mon Feb 05, 2020  1101 Patient does meet criteria for monoclonal antibody infusion based on his history of hypertension as well as his BMI.  Discussed the option with the patient.  He is interested in proceeding.  I will place an order to see if we can provide this during his ED visit.   [YK]  9983 Unable to give MAB in the ED here.   Consult placed to [facility].   [JK]  1410 Laboratory tests are normal.   [JK]    Clinical Course User Index [JK] Dorie Rank, MD   MDM Rules/Calculators/A&P                          Patient presented to the ED for evaluation of nausea and dizziness associated with known COVID-19.  Patient's vital signs are reassuring.  No hypoxia.  No hypotension.  Patient was hydrated with IV fluids and suspect he was dehydrated.  Tented to  give medical and antibiotic unable do so on the CT.  I have sent a referral message to the COVID-19 team monoclonal antibody infusion clinic.  No hypoxia.  Patient appears stable for discharge. Final Clinical Impression(s) / ED Diagnoses Final diagnoses:  [id number]  virus infection    Rx / DC Orders ED Discharge Orders         Ordered    ondansetron (ZOFRAN ODT) 8 MG disintegrating tablet  Every 8 hours PRN     Discontinue  Reprint     02/05/20 1410           Dorie Rank, MD 02/05/20 1413

## 2020-02-05 NOTE — Discharge Instructions (Addendum)
Take the medications as needed for nausea.  Continue to stay hydrated.  I left your information with our Covid monoclonal antibody infusion clinic.  You should receive a call from them.  Return to the ED for shortness of breath, difficulty breathing

## 2020-02-05 NOTE — ED Notes (Signed)
Called lab to check on status of BMP. Lab reports it was placed in wrong bin and will process it now.

## 2020-02-05 NOTE — ED Notes (Signed)
Pt verbalizes understanding of DC instructions. Pt belongings returned and is ambulatory out of ED.  

## 2020-02-05 NOTE — ED Notes (Signed)
Per pharmacy unable to give Casirivimab in patient. Consent form not available on site will need referral to [facility] out patient facility. Tomi Bamberger Md made aware.

## 2020-02-05 NOTE — Telephone Encounter (Signed)
Patient called stating he was diagnosed with covid a week and a half ago. He has since has fever, fatigue and nausea but started feeling dizzy yesterday. Unable to eat and drink very much. Per Dr. Nicki Reaper patient was sent to ER.

## 2020-02-06 ENCOUNTER — Telehealth: Payer: Self-pay | Admitting: Physician Assistant

## 2020-02-06 ENCOUNTER — Telehealth: Payer: Self-pay | Admitting: Infectious Diseases

## 2020-02-06 NOTE — ED Notes (Signed)
Opened chart to answer question for pt regarding infusion clinic appointment

## 2020-02-06 NOTE — Telephone Encounter (Signed)
Duplicate entry - patient has been set up for Monoclonal Ab infusion in [place]

## 2020-02-06 NOTE — Telephone Encounter (Signed)
Reviewed. DR. Lovena Le

## 2020-02-06 NOTE — Telephone Encounter (Signed)
Called to discuss with Nathaniel Reed about Covid symptoms and the use of bamlanivimab/etesevimab or casirivimab/imdevimab, a monoclonal antibody infusion for those with mild to moderate Covid symptoms and at a high risk of hospitalization.     Pt is qualified for this infusion at the [facility] infusion center due to co-morbid conditions and/or a member of an at-risk group, however our infusion center is closed tomorrow and he lives closer to [employer]. I have referred him to the [place] clinic who can accomodate him tomorrow. Symptoms tier reviewed as well as criteria for ending isolation.  Symptoms reviewed that would warrant ED/Hospital evaluation. Preventative practices reviewed. Patient verbalized understanding. Patient advised to call back if he decides that he does want to get infusion. Callback number to the infusion center given. Patient advised to go to Urgent care or ED with severe symptoms. Last date pt would be eligible for infusion is 7/1.  Patient Active Problem List   Diagnosis Date Noted  . Essential hypertension 08/12/2015    Angelena Form PA-C

## 2020-04-10 ENCOUNTER — Other Ambulatory Visit: Payer: Self-pay | Admitting: Family Medicine

## 2020-04-12 NOTE — Telephone Encounter (Signed)
Last seen 09/06/19. Call and schedule visit and route back

## 2020-04-16 NOTE — Telephone Encounter (Signed)
Pt has transferred to another provider

## 2020-04-26 ENCOUNTER — Other Ambulatory Visit: Payer: Self-pay | Admitting: Family Medicine

## 2020-07-01 ENCOUNTER — Other Ambulatory Visit: Payer: Self-pay

## 2020-07-01 ENCOUNTER — Other Ambulatory Visit: Payer: Self-pay | Admitting: Nurse Practitioner

## 2020-07-01 ENCOUNTER — Ambulatory Visit
Admission: RE | Admit: 2020-07-01 | Discharge: 2020-07-01 | Disposition: A | Discharge status: Home / Self Care | Payer: Self-pay | Source: Ambulatory Visit | Attending: Nurse Practitioner | Admitting: Nurse Practitioner

## 2020-07-01 DIAGNOSIS — T1490XA Injury, unspecified, initial encounter: Secondary | ICD-10-CM

## 2024-02-07 ENCOUNTER — Other Ambulatory Visit: Payer: Self-pay | Admitting: Family Medicine

## 2024-02-07 DIAGNOSIS — R634 Abnormal weight loss: Secondary | ICD-10-CM

## 2024-02-07 DIAGNOSIS — I10 Essential (primary) hypertension: Secondary | ICD-10-CM

## 2024-02-08 ENCOUNTER — Other Ambulatory Visit: Payer: Self-pay | Admitting: Family Medicine

## 2024-02-08 DIAGNOSIS — R7989 Other specified abnormal findings of blood chemistry: Secondary | ICD-10-CM

## 2024-02-09 ENCOUNTER — Ambulatory Visit (HOSPITAL_BASED_OUTPATIENT_CLINIC_OR_DEPARTMENT_OTHER)
Admission: RE | Admit: 2024-02-09 | Discharge: 2024-02-09 | Disposition: A | Discharge status: Home / Self Care | Payer: Self-pay | Source: Ambulatory Visit | Attending: Family Medicine | Admitting: Family Medicine

## 2024-02-09 ENCOUNTER — Ambulatory Visit (HOSPITAL_BASED_OUTPATIENT_CLINIC_OR_DEPARTMENT_OTHER)
Admission: RE | Admit: 2024-02-09 | Discharge: 2024-02-09 | Disposition: A | Discharge status: Home / Self Care | Source: Ambulatory Visit | Attending: Family Medicine | Admitting: Family Medicine

## 2024-02-09 DIAGNOSIS — R7989 Other specified abnormal findings of blood chemistry: Secondary | ICD-10-CM | POA: Diagnosis present

## 2024-02-09 DIAGNOSIS — I10 Essential (primary) hypertension: Secondary | ICD-10-CM | POA: Insufficient documentation

## 2024-02-09 DIAGNOSIS — R634 Abnormal weight loss: Secondary | ICD-10-CM | POA: Insufficient documentation

## 2024-02-28 ENCOUNTER — Other Ambulatory Visit (HOSPITAL_BASED_OUTPATIENT_CLINIC_OR_DEPARTMENT_OTHER)

## 2024-06-01 ENCOUNTER — Other Ambulatory Visit (HOSPITAL_BASED_OUTPATIENT_CLINIC_OR_DEPARTMENT_OTHER): Payer: Self-pay | Admitting: Family Medicine

## 2024-06-01 DIAGNOSIS — Z8249 Family history of ischemic heart disease and other diseases of the circulatory system: Secondary | ICD-10-CM

## 2024-08-16 ENCOUNTER — Ambulatory Visit (HOSPITAL_BASED_OUTPATIENT_CLINIC_OR_DEPARTMENT_OTHER)
Admission: RE | Admit: 2024-08-16 | Discharge: 2024-08-16 | Disposition: A | Discharge status: Home / Self Care | Payer: Self-pay | Source: Ambulatory Visit | Attending: Family Medicine | Admitting: Family Medicine

## 2024-08-16 DIAGNOSIS — Z8249 Family history of ischemic heart disease and other diseases of the circulatory system: Secondary | ICD-10-CM
